# Patient Record
Sex: Male | Born: 1985
Health system: Southern US, Community
[De-identification: ages and names within clinical notes are randomized; demographics above are authoritative.]

---
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
## 2007-03-03 ENCOUNTER — Emergency Department (HOSPITAL_COMMUNITY): Admission: EM | Admit: 2007-03-03 | Discharge: 2007-03-03 | Payer: Self-pay | Admitting: Emergency Medicine

## 2007-03-10 ENCOUNTER — Emergency Department (HOSPITAL_COMMUNITY): Admission: EM | Admit: 2007-03-10 | Discharge: 2007-03-10 | Payer: Self-pay | Admitting: Emergency Medicine

## 2008-09-02 ENCOUNTER — Emergency Department (HOSPITAL_COMMUNITY): Admission: EM | Admit: 2008-09-02 | Discharge: 2008-09-02 | Payer: Self-pay | Admitting: Emergency Medicine

## 2009-12-05 ENCOUNTER — Emergency Department (HOSPITAL_COMMUNITY): Admission: EM | Admit: 2009-12-05 | Discharge: 2009-12-06 | Payer: Self-pay | Admitting: Emergency Medicine

## 2009-12-14 ENCOUNTER — Emergency Department (HOSPITAL_COMMUNITY): Admission: EM | Admit: 2009-12-14 | Discharge: 2009-12-14 | Payer: Self-pay | Admitting: Emergency Medicine

## 2010-09-03 LAB — URINALYSIS, ROUTINE W REFLEX MICROSCOPIC
Bilirubin Urine: NEGATIVE
Glucose, UA: NEGATIVE mg/dL
Nitrite: NEGATIVE
Urobilinogen, UA: 0.2 mg/dL (ref 0.0–1.0)

## 2011-03-29 LAB — RAPID STREP SCREEN (MED CTR MEBANE ONLY): Streptococcus, Group A Screen (Direct): NEGATIVE

## 2014-06-12 ENCOUNTER — Encounter (HOSPITAL_COMMUNITY): Payer: Self-pay | Admitting: *Deleted

## 2014-06-12 ENCOUNTER — Emergency Department (HOSPITAL_COMMUNITY)
Admission: EM | Admit: 2014-06-12 | Discharge: 2014-06-13 | Disposition: A | Discharge status: Home / Self Care | Attending: Emergency Medicine | Admitting: Emergency Medicine

## 2014-06-12 ENCOUNTER — Emergency Department (HOSPITAL_COMMUNITY)

## 2014-06-12 DIAGNOSIS — R1031 Right lower quadrant pain: Secondary | ICD-10-CM | POA: Insufficient documentation

## 2014-06-12 DIAGNOSIS — M549 Dorsalgia, unspecified: Secondary | ICD-10-CM

## 2014-06-12 DIAGNOSIS — Z791 Long term (current) use of non-steroidal anti-inflammatories (NSAID): Secondary | ICD-10-CM | POA: Insufficient documentation

## 2014-06-12 DIAGNOSIS — Z79899 Other long term (current) drug therapy: Secondary | ICD-10-CM | POA: Insufficient documentation

## 2014-06-12 DIAGNOSIS — R110 Nausea: Secondary | ICD-10-CM | POA: Insufficient documentation

## 2014-06-12 DIAGNOSIS — R531 Weakness: Secondary | ICD-10-CM | POA: Insufficient documentation

## 2014-06-12 DIAGNOSIS — Z720 Tobacco use: Secondary | ICD-10-CM | POA: Insufficient documentation

## 2014-06-12 LAB — URINALYSIS, ROUTINE W REFLEX MICROSCOPIC
BILIRUBIN URINE: NEGATIVE
GLUCOSE, UA: NEGATIVE mg/dL
Hgb urine dipstick: NEGATIVE
Ketones, ur: NEGATIVE mg/dL
Leukocytes, UA: NEGATIVE
NITRITE: NEGATIVE
PROTEIN: NEGATIVE mg/dL
Specific Gravity, Urine: 1.021 (ref 1.005–1.030)
UROBILINOGEN UA: 0.2 mg/dL (ref 0.0–1.0)
pH: 6.5 (ref 5.0–8.0)

## 2014-06-12 LAB — COMPREHENSIVE METABOLIC PANEL
ALK PHOS: 59 U/L (ref 39–117)
ALT: 21 U/L (ref 0–53)
AST: 30 U/L (ref 0–37)
Albumin: 3.9 g/dL (ref 3.5–5.2)
Anion gap: 6 (ref 5–15)
BILIRUBIN TOTAL: 0.7 mg/dL (ref 0.3–1.2)
BUN: 11 mg/dL (ref 6–23)
CHLORIDE: 106 meq/L (ref 96–112)
CO2: 27 mmol/L (ref 19–32)
Calcium: 9.2 mg/dL (ref 8.4–10.5)
Creatinine, Ser: 0.83 mg/dL (ref 0.50–1.35)
GLUCOSE: 101 mg/dL — AB (ref 70–99)
POTASSIUM: 4.6 mmol/L (ref 3.5–5.1)
Sodium: 139 mmol/L (ref 135–145)
TOTAL PROTEIN: 6.3 g/dL (ref 6.0–8.3)

## 2014-06-12 LAB — CBC WITH DIFFERENTIAL/PLATELET
BASOS PCT: 0 % (ref 0–1)
Basophils Absolute: 0 10*3/uL (ref 0.0–0.1)
Eosinophils Absolute: 0.1 10*3/uL (ref 0.0–0.7)
Eosinophils Relative: 3 % (ref 0–5)
HCT: 38.5 % — ABNORMAL LOW (ref 39.0–52.0)
Hemoglobin: 13.4 g/dL (ref 13.0–17.0)
LYMPHS ABS: 1.9 10*3/uL (ref 0.7–4.0)
Lymphocytes Relative: 39 % (ref 12–46)
MCH: 30.2 pg (ref 26.0–34.0)
MCHC: 34.8 g/dL (ref 30.0–36.0)
MCV: 86.9 fL (ref 78.0–100.0)
MONO ABS: 0.4 10*3/uL (ref 0.1–1.0)
MONOS PCT: 8 % (ref 3–12)
NEUTROS ABS: 2.4 10*3/uL (ref 1.7–7.7)
Neutrophils Relative %: 50 % (ref 43–77)
Platelets: 292 10*3/uL (ref 150–400)
RBC: 4.43 MIL/uL (ref 4.22–5.81)
RDW: 11.8 % (ref 11.5–15.5)
WBC: 4.8 10*3/uL (ref 4.0–10.5)

## 2014-06-12 LAB — LIPASE, BLOOD: Lipase: 42 U/L (ref 11–59)

## 2014-06-12 NOTE — ED Notes (Signed)
Pt is here with right lower back pain that radiates to RLQ since the beginning of December.  Pt has some pain with urination.  Pt states that he has had CT

## 2014-06-12 NOTE — ED Notes (Signed)
Pt received flu shot beginning of December, then started having RLQ pain radiating to back, with some right leg weakness. Pt also c/o urinary freqency. Pt has had a scan, has brought paperwork from previous visit to hospital.

## 2014-06-12 NOTE — ED Provider Notes (Signed)
CSN: [id number]     Arrival date & time 06/12/14  1724 History   First MD Initiated Contact with Patient 06/12/14 2125     Chief Complaint  Patient presents with  . Back Pain  . Abdominal Pain   HPI  Patient is a 28 year old male who presents emergency room for evaluation of back pain, abdominal pain, and urinary frequency and urgency. Patient states that he has been having back pain which radiates around to the right lower quadrant. He states his pain is constant and achy. He feels that it is associated with some right leg weakness.he also feels that he has some tingling and numbness with some positions. He feels that he has had some pain with sitting on his bottom.he feels that he has been limping and walking strange. He states that he has been evaluated at another hospital and had a CT scan of his abdomen and pelvis which was negative. He would like to have an MRI of his back today. Patient also states that he feels that some of his symptoms are coming from a flu shot which he received on December 3.patient admits to some urinary frequency and urgency. He denies hematuria. He denies penile discharge. He denies a history of STDs. He states that he is sexually active but does not use condoms every time. Patient denies any back injury that he is aware of. He has no history of back problems. He denies history of osteoporosis, IV drug use, loss of his bowel or bladder, cancer.  Patient states that he is otherwise healthy. He is currently taking naproxen and Flexeril which he feels helps but has not completely resolved his issues.  History reviewed. No pertinent past medical history. History reviewed. No pertinent past surgical history. No family history on file. History  Substance Use Topics  . Smoking status: Current Every Day Smoker  . Smokeless tobacco: Not on file  . Alcohol Use: No    Review of Systems  Constitutional: Negative for fever, chills and fatigue.  Respiratory: Positive for  cough. Negative for shortness of breath.   Cardiovascular: Negative for chest pain and palpitations.  Gastrointestinal: Positive for nausea and abdominal pain. Negative for vomiting, diarrhea, constipation, blood in stool and anal bleeding.  Genitourinary: Positive for urgency and frequency. Negative for dysuria, hematuria, flank pain, decreased urine volume, discharge, genital sores, penile pain and testicular pain.  Musculoskeletal: Positive for back pain and gait problem.  Neurological: Positive for weakness. Negative for numbness.  All other systems reviewed and are negative.     Allergies  Review of patient's allergies indicates no known allergies.  Home Medications   Prior to Admission medications   Medication Sig Start Date End Date Taking? Authorizing Provider  cyclobenzaprine (FLEXERIL) 10 MG tablet Take 10 mg by mouth at bedtime.   Yes Historical Provider, MD  naproxen (NAPROSYN) 500 MG tablet Take 500 mg by mouth 3 (three) times daily with meals.   Yes Historical Provider, MD  predniSONE (DELTASONE) 10 MG tablet Day 1 take 6 pills Day 2 take 5 pills Day 3 take 4 pills Day 4 take 3 pills Day 5 take 2 pills Day 6 take 1 pill 06/13/14   Jilene Spohr A Forcucci, PA-C   BP 117/79 mmHg  Pulse 80  Temp(Src) 98.1 F (36.7 C) (Oral)  Resp 16  SpO2 99% Physical Exam  Constitutional: He is oriented to person, place, and time. He appears well-developed and well-nourished. No distress.  HENT:  Head: Normocephalic and atraumatic.  Mouth/Throat: Oropharynx is clear and moist. No oropharyngeal exudate.  Eyes: Conjunctivae and EOM are normal. Pupils are equal, round, and reactive to light. No scleral icterus.  Neck: Normal range of motion. Neck supple. No JVD present. No thyromegaly present.  Cardiovascular: Normal rate, regular rhythm, normal heart sounds and intact distal pulses.  Exam reveals no gallop and no friction rub.   No murmur heard. Pulmonary/Chest: Effort normal and  breath sounds normal. No respiratory distress. He has no wheezes. He has no rales. He exhibits no tenderness.  Abdominal: Soft. Normal appearance and bowel sounds are normal. He exhibits no distension and no mass. There is tenderness in the right lower quadrant and suprapubic area. There is no rigidity, no rebound, no guarding, no tenderness at McBurney's point and negative Murphy's sign.  Musculoskeletal: Normal range of motion.  Lymphadenopathy:    He has no cervical adenopathy.  Neurological: He is alert and oriented to person, place, and time. He has normal strength. No cranial nerve deficit or sensory deficit.  Skin: Skin is warm and dry. He is not diaphoretic.  Psychiatric: He has a normal mood and affect. His behavior is normal. Judgment and thought content normal.  Nursing note and vitals reviewed.   ED Course  Procedures (including critical care time) Labs Review Labs Reviewed  CBC WITH DIFFERENTIAL - Abnormal; Notable for the following:    HCT 38.5 (*)    All other components within normal limits  COMPREHENSIVE METABOLIC PANEL - Abnormal; Notable for the following:    Glucose, Bld 101 (*)    All other components within normal limits  GC/CHLAMYDIA PROBE AMP  URINALYSIS, ROUTINE W REFLEX MICROSCOPIC  LIPASE, BLOOD    Imaging Review Dg Lumbar Spine Complete  06/12/2014   CLINICAL DATA:  Chronic lower back pain for 1 month. Initial encounter.  EXAM: LUMBAR SPINE - COMPLETE 4+ VIEW  COMPARISON:  Lumbar spine MRI performed 09/18/2005  FINDINGS: There is no evidence of fracture or subluxation. Vertebral bodies demonstrate normal height and alignment. Intervertebral disc spaces are preserved. The visualized neural foramina are grossly unremarkable in appearance.  The visualized bowel gas pattern is unremarkable in appearance; air and stool are noted within the colon. The sacroiliac joints are within normal limits.  IMPRESSION: No evidence of fracture or subluxation along the lumbar  spine.   Electronically Signed   By: Roanna RaiderJeffery  Chang M.D.   On: 06/12/2014 23:56     EKG Interpretation None      MDM   Final diagnoses:  Back pain  Right lower quadrant abdominal pain   Patient is a 28 y.o. Male who presents to the ED for evaluation of back pain and abdominal pain.  Physical exam reveals no red flags for cauda equina.  There are no focal neuro deficits.  Plain film xrays are negative.  UA is negative.  Prostate exam is unremarkable.  GC is pending.  Labs are negative.  Suspect that this may be musculoskeletal in nature.  Will try a course of prednisone and will refer the patient to Dr. Jule SerNudleman for further evaluation.  Patient to return for symptoms of cauda equina.  Patient is stable for discharge at this time.  Patient discussed with Dr. Lynelle DoctorKnapp who agrees with the above workup and plan.      Eben Burowourtney A Forcucci, PA-C 06/13/14 [id number]  Linwood DibblesJon Knapp, MD 06/15/14 [phone]

## 2014-06-12 NOTE — ED Notes (Signed)
Pt alert and oriented. VSS

## 2014-06-13 MED ORDER — PREDNISONE 10 MG PO TABS: ORAL_TABLET | ORAL | Status: DC

## 2014-06-13 NOTE — Discharge Instructions (Signed)
Back Pain, Adult Low back pain is very common. About 1 in 5 people have back pain.The cause of low back pain is rarely dangerous. The pain often gets better over time.About half of people with a sudden onset of back pain feel better in just 2 weeks. About 8 in 10 people feel better by 6 weeks.  CAUSES Some common causes of back pain include:  Strain of the muscles or ligaments supporting the spine.  Wear and tear (degeneration) of the spinal discs.  Arthritis.  Direct injury to the back. DIAGNOSIS Most of the time, the direct cause of low back pain is not known.However, back pain can be treated effectively even when the exact cause of the pain is unknown.Answering your caregiver's questions about your overall health and symptoms is one of the most accurate ways to make sure the cause of your pain is not dangerous. If your caregiver needs more information, he or she may order lab work or imaging tests (X-rays or MRIs).However, even if imaging tests show changes in your back, this usually does not require surgery. HOME CARE INSTRUCTIONS For many people, back pain returns.Since low back pain is rarely dangerous, it is often a condition that people can learn to manageon their own.   Remain active. It is stressful on the back to sit or stand in one place. Do not sit, drive, or stand in one place for more than 30 minutes at a time. Take short walks on level surfaces as soon as pain allows.Try to increase the length of time you walk each day.  Do not stay in bed.Resting more than 1 or 2 days can delay your recovery.  Do not avoid exercise or work.Your body is made to move.It is not dangerous to be active, even though your back may hurt.Your back will likely heal faster if you return to being active before your pain is gone.  Pay attention to your body when you bend and lift. Many people have less discomfortwhen lifting if they bend their knees, keep the load close to their bodies,and  avoid twisting. Often, the most comfortable positions are those that put less stress on your recovering back.  Find a comfortable position to sleep. Use a firm mattress and lie on your side with your knees slightly bent. If you lie on your back, put a pillow under your knees.  Only take over-the-counter or prescription medicines as directed by your caregiver. Over-the-counter medicines to reduce pain and inflammation are often the most helpful.Your caregiver may prescribe muscle relaxant drugs.These medicines help dull your pain so you can more quickly return to your normal activities and healthy exercise.  Put ice on the injured area.  Put ice in a plastic bag.  Place a towel between your skin and the bag.  Leave the ice on for 15-20 minutes, 03-04 times a day for the first 2 to 3 days. After that, ice and heat may be alternated to reduce pain and spasms.  Ask your caregiver about trying back exercises and gentle massage. This may be of some benefit.  Avoid feeling anxious or stressed.Stress increases muscle tension and can worsen back pain.It is important to recognize when you are anxious or stressed and learn ways to manage it.Exercise is a great option. SEEK MEDICAL CARE IF:  You have pain that is not relieved with rest or medicine.  You have pain that does not improve in 1 week.  You have new symptoms.  You are generally not feeling well. SEEK   IMMEDIATE MEDICAL CARE IF:   You have pain that radiates from your back into your legs.  You develop new bowel or bladder control problems.  You have unusual weakness or numbness in your arms or legs.  You develop nausea or vomiting.  You develop abdominal pain.  You feel faint. Document Released: 06/04/2005 Document Revised: 12/04/2011 Document Reviewed: 10/06/2013 Exit[employer] Patient Information 2015 [employer]. This information is not intended to replace advice given to you by your health care provider. Make sure you  discuss any questions you have with your health care provider.  

## 2017-09-27 ENCOUNTER — Other Ambulatory Visit: Payer: Self-pay

## 2017-09-27 ENCOUNTER — Emergency Department (HOSPITAL_COMMUNITY)
Admission: EM | Admit: 2017-09-27 | Discharge: 2017-09-28 | Disposition: A | Discharge status: Home / Self Care | Payer: Self-pay | Attending: Emergency Medicine | Admitting: Emergency Medicine

## 2017-09-27 ENCOUNTER — Emergency Department (HOSPITAL_COMMUNITY): Payer: Self-pay

## 2017-09-27 ENCOUNTER — Encounter (HOSPITAL_COMMUNITY): Payer: Self-pay

## 2017-09-27 DIAGNOSIS — F17200 Nicotine dependence, unspecified, uncomplicated: Secondary | ICD-10-CM | POA: Insufficient documentation

## 2017-09-27 DIAGNOSIS — M79674 Pain in right toe(s): Secondary | ICD-10-CM | POA: Insufficient documentation

## 2017-09-27 DIAGNOSIS — Z79899 Other long term (current) drug therapy: Secondary | ICD-10-CM | POA: Insufficient documentation

## 2017-09-27 NOTE — ED Triage Notes (Signed)
Pt states that he kicked a chair today and hurt his R pinky toe

## 2017-09-28 MED ORDER — ACETAMINOPHEN 325 MG PO TABS: 650.0000 mg | ORAL_TABLET | Freq: Four times a day (QID) | ORAL | 0 refills | Status: DC | PRN

## 2017-09-28 MED ORDER — IBUPROFEN 400 MG PO TABS: 400.0000 mg | ORAL_TABLET | Freq: Four times a day (QID) | ORAL | 0 refills | Status: DC | PRN

## 2017-09-28 NOTE — ED Provider Notes (Signed)
[facility]   CSN: [id number] Arrival date & time: 09/27/17  2209     History   Chief Complaint Chief Complaint  Patient presents with  . Toe Pain    HPI Andres Weaver HasM Saiki is a 32 y.o. male.  HPI   Patient is a 32 year old male is presenting to the ED complaining of sudden onset, constant right fifth toe pain that began after he stubbed his toe on a chair.  Pain is 10/10 in is worse with movement.  Is been able to walk.  Denies numbness to the toe.  Denies any breaks in skin.  No other injuries.  Not tried taking any medication for the pain.  History reviewed. No pertinent past medical history.  There are no active problems to display for this patient.   History reviewed. No pertinent surgical history.      Home Medications    Prior to Admission medications   Medication Sig Start Date End Date Taking? Authorizing Provider  acetaminophen (TYLENOL) 325 MG tablet Take 2 tablets (650 mg total) by mouth every 6 (six) hours as needed. Do not take more than 4000mg  of tylenol per day 09/28/17   Yeshua Stryker S, PA-C  cyclobenzaprine (FLEXERIL) 10 MG tablet Take 10 mg by mouth at bedtime.    [provider]  ibuprofen (ADVIL,MOTRIN) 400 MG tablet Take 1 tablet (400 mg total) by mouth every 6 (six) hours as needed. 09/28/17   Oral Hallgren S, PA-C  naproxen (NAPROSYN) 500 MG tablet Take 500 mg by mouth 3 (three) times daily with meals.    [provider]  predniSONE (DELTASONE) 10 MG tablet Day 1 take 6 pills Day 2 take 5 pills Day 3 take 4 pills Day 4 take 3 pills Day 5 take 2 pills Day 6 take 1 pill 06/13/14   Terri PiedraForcucci, Courtney, PA-C    Family History No family history on file.  Social History Social History   Tobacco Use  . Smoking status: Current Every Day Smoker  . Smokeless tobacco: Never Used  Substance Use Topics  . Alcohol use: No  . Drug use: No     Allergies   Patient has no known  allergies.   Review of Systems Review of Systems  Musculoskeletal:       Toe pain  Skin: Negative for wound.  Neurological: Negative for numbness.     Physical Exam Updated Vital Signs BP 131/75 (BP Location: Right Arm)   Pulse 64   Temp 99.2 F (37.3 C) (Oral)   Resp 16   SpO2 100%   Physical Exam  Constitutional: He is oriented to person, place, and time. He appears well-developed and well-nourished. No distress.  Eyes: Conjunctivae are normal.  Cardiovascular: Normal rate and regular rhythm.  Pulmonary/Chest: Effort normal.  Musculoskeletal:  Tenderness along the lateral aspect of the right foot with exquisite tenderness to the right fifth toe.  Patient is able to wiggle toes however it is painful.  He has sensation intact to all 5 toes on the right foot.  Brisk cap refill in all 5 toes in the right foot.  No obvious breaks in the skin.  Neurological: He is alert and oriented to person, place, and time.  Skin: Skin is warm and dry.     ED Treatments / Results  Labs (all labs ordered are listed, but only abnormal results are displayed) Labs Reviewed - No data to display  EKG None  Radiology Dg Foot  Complete Right  Result Date: 09/27/2017 CLINICAL DATA:  Patient stubbed foot against a chair. Right lateral foot pain. EXAM: RIGHT FOOT COMPLETE - 3+ VIEW COMPARISON:  None. FINDINGS: There is no evidence of fracture or dislocation. There is no evidence of arthropathy or other focal bone abnormality. Soft tissue swelling along the lateral aspect of the forefoot. No acute fracture is identified. No joint dislocation is seen. IMPRESSION: Mild soft tissue swelling along the lateral aspect of the forefoot. No fracture nor joint dislocation Electronically Signed   By: Tollie Eth M.D.   On: 09/27/2017 23:04    Procedures Procedures (including critical care time)  Medications Ordered in ED Medications - No data to display   Initial Impression / Assessment and Plan / ED  Course  I have reviewed the triage vital signs and the nursing notes.  Pertinent labs & imaging results that were available during my care of the patient were reviewed by me and considered in my medical decision making (see chart for details).      Final Clinical Impressions(s) / ED Diagnoses   Final diagnoses:  Pain of toe of right foot   Patient presenting with toe pain.  Vital signs stable, nontoxic appearing.  X-ray of right foot negative for acute fracture or abnormality.  Postop boot given to patient.  Pain medications given to patient at discharge.  Advised to follow-up with primary care and return if worse.  All questions answered and patient understands plan and reasons to return.  ED Discharge Orders        Ordered    acetaminophen (TYLENOL) 325 MG tablet  Every 6 hours PRN     09/28/17 0041    ibuprofen (ADVIL,MOTRIN) 400 MG tablet  Every 6 hours PRN     09/28/17 0041       Christi Wirick S, PA-C 09/28/17 0045    Shaune Pollack, MD 09/28/17 1112

## 2017-09-28 NOTE — Discharge Instructions (Addendum)
The x-ray of your toe today was negative for any acute fracture or dislocation.  You may treat your pain with Tylenol and ibuprofen.  You may alternate taking Tylenol and Ibuprofen as needed for pain control. You may take 400-600 mg of ibuprofen every 6 hours and [phone] mg of Tylenol every 6 hours. Do not exceed 4000 mg of Tylenol daily as this can lead to liver damage. Also, make sure to take Ibuprofen with meals as it can cause an upset stomach. Do not take other NSAIDs while taking Ibuprofen such as (Aleve, Naprosyn, Aspirin, Celebrex, etc) and do not take more than the prescribed dose as this can lead to ulcers and bleeding in your GI tract. You may use warm and cold compresses to help with your symptoms.   You may wear the postop boot for comfort over the next several days until your pain has improved.  Please follow up with your primary doctor within the next 7-10 days for re-evaluation and further treatment of your symptoms.   Please return to the ER sooner if you have any new or worsening symptoms.

## 2019-05-13 ENCOUNTER — Emergency Department (HOSPITAL_COMMUNITY): Payer: Self-pay

## 2019-05-13 ENCOUNTER — Other Ambulatory Visit: Payer: Self-pay

## 2019-05-13 ENCOUNTER — Emergency Department (HOSPITAL_COMMUNITY): Payer: Self-pay | Admitting: Anesthesiology

## 2019-05-13 ENCOUNTER — Encounter (HOSPITAL_COMMUNITY)
Admission: EM | Disposition: A | Discharge status: Home / Self Care | Payer: Self-pay | Source: Home / Self Care | Attending: Emergency Medicine

## 2019-05-13 ENCOUNTER — Encounter (HOSPITAL_COMMUNITY): Payer: Self-pay | Admitting: Emergency Medicine

## 2019-05-13 ENCOUNTER — Ambulatory Visit (HOSPITAL_COMMUNITY)
Admission: EM | Admit: 2019-05-13 | Discharge: 2019-05-13 | Disposition: A | Discharge status: Home / Self Care | Payer: Self-pay | Attending: Emergency Medicine | Admitting: Emergency Medicine

## 2019-05-13 DIAGNOSIS — K353 Acute appendicitis with localized peritonitis, without perforation or gangrene: Secondary | ICD-10-CM

## 2019-05-13 DIAGNOSIS — Z20828 Contact with and (suspected) exposure to other viral communicable diseases: Secondary | ICD-10-CM | POA: Insufficient documentation

## 2019-05-13 DIAGNOSIS — R188 Other ascites: Secondary | ICD-10-CM | POA: Insufficient documentation

## 2019-05-13 DIAGNOSIS — F17210 Nicotine dependence, cigarettes, uncomplicated: Secondary | ICD-10-CM | POA: Insufficient documentation

## 2019-05-13 DIAGNOSIS — Z888 Allergy status to other drugs, medicaments and biological substances status: Secondary | ICD-10-CM | POA: Insufficient documentation

## 2019-05-13 DIAGNOSIS — K3580 Unspecified acute appendicitis: Secondary | ICD-10-CM | POA: Insufficient documentation

## 2019-05-13 HISTORY — PX: LAPAROSCOPIC APPENDECTOMY: SHX408

## 2019-05-13 LAB — COMPREHENSIVE METABOLIC PANEL
ALT: 14 U/L (ref 0–44)
AST: 20 U/L (ref 15–41)
Albumin: 4.4 g/dL (ref 3.5–5.0)
Alkaline Phosphatase: 60 U/L (ref 38–126)
Anion gap: 13 (ref 5–15)
BUN: 12 mg/dL (ref 6–20)
CO2: 24 mmol/L (ref 22–32)
Calcium: 9.3 mg/dL (ref 8.9–10.3)
Chloride: 99 mmol/L (ref 98–111)
Creatinine, Ser: 0.91 mg/dL (ref 0.61–1.24)
GFR calc Af Amer: >60 mL/min (ref >60)
GFR calc non Af Amer: >60 mL/min (ref >60)
Glucose, Bld: 115 mg/dL — ABNORMAL HIGH (ref 70–99)
Potassium: 3.4 mmol/L — ABNORMAL LOW (ref 3.5–5.1)
Sodium: 136 mmol/L (ref 135–145)
Total Bilirubin: 1.2 mg/dL (ref 0.3–1.2)
Total Protein: 7.8 g/dL (ref 6.5–8.1)

## 2019-05-13 LAB — CBC
HCT: 44.3 % (ref 39.0–52.0)
Hemoglobin: 14.8 g/dL (ref 13.0–17.0)
MCH: 30.8 pg (ref 26.0–34.0)
MCHC: 33.4 g/dL (ref 30.0–36.0)
MCV: 92.3 fL (ref 80.0–100.0)
Platelets: 284 10*3/uL (ref 150–400)
RBC: 4.8 MIL/uL (ref 4.22–5.81)
RDW: 11.6 % (ref 11.5–15.5)
WBC: 12.3 10*3/uL — ABNORMAL HIGH (ref 4.0–10.5)
nRBC: 0 % (ref 0.0–0.2)

## 2019-05-13 LAB — POC SARS CORONAVIRUS 2 AG -  ED: SARS Coronavirus 2 Ag: NEGATIVE

## 2019-05-13 LAB — URINALYSIS, ROUTINE W REFLEX MICROSCOPIC
Bilirubin Urine: NEGATIVE
Glucose, UA: NEGATIVE mg/dL
Hgb urine dipstick: NEGATIVE
Ketones, ur: 20 mg/dL — AB
Leukocytes,Ua: NEGATIVE
Nitrite: NEGATIVE
Protein, ur: NEGATIVE mg/dL
Specific Gravity, Urine: 1.032 — ABNORMAL HIGH (ref 1.005–1.030)
pH: 6 (ref 5.0–8.0)

## 2019-05-13 LAB — LIPASE, BLOOD: Lipase: 28 U/L (ref 11–51)

## 2019-05-13 LAB — SARS CORONAVIRUS 2 BY RT PCR (HOSPITAL ORDER, PERFORMED IN ~~LOC~~ HOSPITAL LAB): SARS Coronavirus 2: NEGATIVE

## 2019-05-13 SURGERY — APPENDECTOMY, LAPAROSCOPIC
Anesthesia: General | Site: Abdomen

## 2019-05-13 MED ORDER — KETOROLAC TROMETHAMINE 15 MG/ML IJ SOLN
15.0000 mg | Freq: Once | INTRAMUSCULAR | Status: AC
  Administered 2019-05-13: 15 mg via INTRAVENOUS
  Filled 2019-05-13: qty 1

## 2019-05-13 MED ORDER — MIDAZOLAM HCL 2 MG/2ML IJ SOLN
INTRAMUSCULAR | Status: AC
  Filled 2019-05-13: qty 2

## 2019-05-13 MED ORDER — MORPHINE SULFATE (PF) 4 MG/ML IV SOLN
4.0000 mg | Freq: Once | INTRAVENOUS | Status: AC
  Administered 2019-05-13: 10:00:00 4 mg via INTRAVENOUS
  Filled 2019-05-13: qty 1

## 2019-05-13 MED ORDER — HYDROMORPHONE HCL 1 MG/ML IJ SOLN
0.5000 mg | INTRAMUSCULAR | Status: DC | PRN
  Administered 2019-05-13: 11:00:00 1 mg via INTRAVENOUS
  Filled 2019-05-13: qty 1

## 2019-05-13 MED ORDER — SUCCINYLCHOLINE CHLORIDE 200 MG/10ML IV SOSY
PREFILLED_SYRINGE | INTRAVENOUS | Status: AC
  Filled 2019-05-13: qty 10

## 2019-05-13 MED ORDER — LIDOCAINE 2% (20 MG/ML) 5 ML SYRINGE
INTRAMUSCULAR | Status: DC | PRN
  Administered 2019-05-13: 60 mg via INTRAVENOUS

## 2019-05-13 MED ORDER — SODIUM CHLORIDE 0.9 % IV BOLUS (SEPSIS)
1000.0000 mL | Freq: Once | INTRAVENOUS | Status: AC
  Administered 2019-05-13: 1000 mL via INTRAVENOUS

## 2019-05-13 MED ORDER — SUCCINYLCHOLINE CHLORIDE 200 MG/10ML IV SOSY
PREFILLED_SYRINGE | INTRAVENOUS | Status: AC
  Filled 2019-05-13: qty 20

## 2019-05-13 MED ORDER — SUCCINYLCHOLINE CHLORIDE 200 MG/10ML IV SOSY
PREFILLED_SYRINGE | INTRAVENOUS | Status: DC | PRN
  Administered 2019-05-13: 100 mg via INTRAVENOUS

## 2019-05-13 MED ORDER — POTASSIUM CHLORIDE IN NACL 20-0.9 MEQ/L-% IV SOLN
INTRAVENOUS | Status: DC
  Administered 2019-05-13: 12:00:00 via INTRAVENOUS
  Filled 2019-05-13: qty 1000

## 2019-05-13 MED ORDER — ROCURONIUM BROMIDE 10 MG/ML (PF) SYRINGE
PREFILLED_SYRINGE | INTRAVENOUS | Status: AC
  Filled 2019-05-13: qty 20

## 2019-05-13 MED ORDER — ROCURONIUM BROMIDE 10 MG/ML (PF) SYRINGE
PREFILLED_SYRINGE | INTRAVENOUS | Status: AC
  Filled 2019-05-13: qty 10

## 2019-05-13 MED ORDER — SODIUM CHLORIDE 0.9% FLUSH: 3.0000 mL | Freq: Once | INTRAVENOUS | Status: DC

## 2019-05-13 MED ORDER — CELECOXIB 200 MG PO CAPS
400.0000 mg | ORAL_CAPSULE | Freq: Once | ORAL | Status: AC
  Administered 2019-05-13: 13:00:00 400 mg via ORAL

## 2019-05-13 MED ORDER — LIDOCAINE 2% (20 MG/ML) 5 ML SYRINGE
INTRAMUSCULAR | Status: AC
  Filled 2019-05-13: qty 10

## 2019-05-13 MED ORDER — BUPIVACAINE HCL (PF) 0.25 % IJ SOLN
INTRAMUSCULAR | Status: AC
  Filled 2019-05-13: qty 60

## 2019-05-13 MED ORDER — ONDANSETRON HCL 4 MG/2ML IJ SOLN
INTRAMUSCULAR | Status: AC
  Filled 2019-05-13: qty 2

## 2019-05-13 MED ORDER — FENTANYL CITRATE (PF) 250 MCG/5ML IJ SOLN
INTRAMUSCULAR | Status: AC
  Filled 2019-05-13: qty 5

## 2019-05-13 MED ORDER — FENTANYL CITRATE (PF) 250 MCG/5ML IJ SOLN
INTRAMUSCULAR | Status: DC | PRN
  Administered 2019-05-13: 100 ug via INTRAVENOUS

## 2019-05-13 MED ORDER — TRAMADOL HCL 50 MG PO TABS
ORAL_TABLET | ORAL | Status: AC
  Filled 2019-05-13: qty 1

## 2019-05-13 MED ORDER — MIDAZOLAM HCL 5 MG/5ML IJ SOLN
INTRAMUSCULAR | Status: DC | PRN
  Administered 2019-05-13: 2 mg via INTRAVENOUS

## 2019-05-13 MED ORDER — LIDOCAINE 2% (20 MG/ML) 5 ML SYRINGE
INTRAMUSCULAR | Status: AC
  Filled 2019-05-13: qty 5

## 2019-05-13 MED ORDER — LACTATED RINGERS IV SOLN
INTRAVENOUS | Status: DC
  Administered 2019-05-13 (×2): via INTRAVENOUS

## 2019-05-13 MED ORDER — ONDANSETRON HCL 4 MG/2ML IJ SOLN
INTRAMUSCULAR | Status: AC
  Filled 2019-05-13: qty 4

## 2019-05-13 MED ORDER — ONDANSETRON HCL 4 MG/2ML IJ SOLN
4.0000 mg | Freq: Once | INTRAMUSCULAR | Status: AC
  Administered 2019-05-13: 08:00:00 4 mg via INTRAVENOUS
  Filled 2019-05-13: qty 2

## 2019-05-13 MED ORDER — PHENYLEPHRINE 40 MCG/ML (10ML) SYRINGE FOR IV PUSH (FOR BLOOD PRESSURE SUPPORT)
PREFILLED_SYRINGE | INTRAVENOUS | Status: AC
  Filled 2019-05-13: qty 20

## 2019-05-13 MED ORDER — ACETAMINOPHEN 500 MG PO TABS
1000.0000 mg | ORAL_TABLET | Freq: Once | ORAL | Status: AC
  Administered 2019-05-13: 13:00:00 1000 mg via ORAL

## 2019-05-13 MED ORDER — TRAMADOL HCL 50 MG PO TABS
50.0000 mg | ORAL_TABLET | Freq: Once | ORAL | Status: AC
  Administered 2019-05-13: 16:00:00 50 mg via ORAL

## 2019-05-13 MED ORDER — IOHEXOL 300 MG/ML  SOLN
80.0000 mL | Freq: Once | INTRAMUSCULAR | Status: AC | PRN
  Administered 2019-05-13: 09:00:00 80 mL via INTRAVENOUS

## 2019-05-13 MED ORDER — BUPIVACAINE HCL (PF) 0.25 % IJ SOLN
INTRAMUSCULAR | Status: DC | PRN
  Administered 2019-05-13: 5 mL

## 2019-05-13 MED ORDER — CELECOXIB 200 MG PO CAPS
ORAL_CAPSULE | ORAL | Status: AC
  Administered 2019-05-13: 400 mg via ORAL
  Filled 2019-05-13: qty 2

## 2019-05-13 MED ORDER — PROPOFOL 10 MG/ML IV BOLUS
INTRAVENOUS | Status: DC | PRN
  Administered 2019-05-13: 200 mg via INTRAVENOUS

## 2019-05-13 MED ORDER — PROPOFOL 10 MG/ML IV BOLUS
INTRAVENOUS | Status: AC
  Filled 2019-05-13: qty 20

## 2019-05-13 MED ORDER — SUGAMMADEX SODIUM 200 MG/2ML IV SOLN
INTRAVENOUS | Status: DC | PRN
  Administered 2019-05-13: 200 mg via INTRAVENOUS

## 2019-05-13 MED ORDER — DEXAMETHASONE SODIUM PHOSPHATE 10 MG/ML IJ SOLN
INTRAMUSCULAR | Status: AC
  Filled 2019-05-13: qty 1

## 2019-05-13 MED ORDER — ACETAMINOPHEN 500 MG PO TABS
ORAL_TABLET | ORAL | Status: AC
  Administered 2019-05-13: 1000 mg via ORAL
  Filled 2019-05-13: qty 2

## 2019-05-13 MED ORDER — PROMETHAZINE HCL 25 MG/ML IJ SOLN: 6.2500 mg | INTRAMUSCULAR | Status: DC | PRN

## 2019-05-13 MED ORDER — SODIUM CHLORIDE 0.9 % IR SOLN
Status: DC | PRN
  Administered 2019-05-13: 1000 mL

## 2019-05-13 MED ORDER — TRAMADOL HCL 50 MG PO TABS: 50.0000 mg | ORAL_TABLET | Freq: Four times a day (QID) | ORAL | 0 refills | Status: DC | PRN

## 2019-05-13 MED ORDER — 0.9 % SODIUM CHLORIDE (POUR BTL) OPTIME
TOPICAL | Status: DC | PRN
  Administered 2019-05-13: 1000 mL

## 2019-05-13 MED ORDER — PHENYLEPHRINE 40 MCG/ML (10ML) SYRINGE FOR IV PUSH (FOR BLOOD PRESSURE SUPPORT)
PREFILLED_SYRINGE | INTRAVENOUS | Status: DC | PRN
  Administered 2019-05-13: 80 ug via INTRAVENOUS

## 2019-05-13 MED ORDER — ONDANSETRON HCL 4 MG/2ML IJ SOLN
4.0000 mg | Freq: Four times a day (QID) | INTRAMUSCULAR | Status: DC | PRN
  Administered 2019-05-13: 14:00:00 4 mg via INTRAVENOUS

## 2019-05-13 MED ORDER — ROCURONIUM BROMIDE 10 MG/ML (PF) SYRINGE
PREFILLED_SYRINGE | INTRAVENOUS | Status: DC | PRN
  Administered 2019-05-13: 40 mg via INTRAVENOUS

## 2019-05-13 MED ORDER — IBUPROFEN 200 MG PO TABS: 600.0000 mg | ORAL_TABLET | Freq: Four times a day (QID) | ORAL | Status: DC | PRN

## 2019-05-13 MED ORDER — ONDANSETRON 4 MG PO TBDP: 4.0000 mg | ORAL_TABLET | Freq: Once | ORAL | Status: DC | PRN

## 2019-05-13 MED ORDER — DEXAMETHASONE SODIUM PHOSPHATE 10 MG/ML IJ SOLN
INTRAMUSCULAR | Status: AC
  Filled 2019-05-13: qty 2

## 2019-05-13 MED ORDER — DEXAMETHASONE SODIUM PHOSPHATE 10 MG/ML IJ SOLN
INTRAMUSCULAR | Status: DC | PRN
  Administered 2019-05-13: 4 mg via INTRAVENOUS

## 2019-05-13 MED ORDER — STERILE WATER FOR IRRIGATION IR SOLN
Status: DC | PRN
  Administered 2019-05-13: 1000 mL

## 2019-05-13 MED ORDER — FENTANYL CITRATE (PF) 100 MCG/2ML IJ SOLN: 25.0000 ug | INTRAMUSCULAR | Status: DC | PRN

## 2019-05-13 MED ORDER — ACETAMINOPHEN 500 MG PO TABS: 500.0000 mg | ORAL_TABLET | Freq: Four times a day (QID) | ORAL | Status: AC | PRN

## 2019-05-13 MED ORDER — PIPERACILLIN-TAZOBACTAM 3.375 G IVPB 30 MIN
3.3750 g | Freq: Once | INTRAVENOUS | Status: AC
  Administered 2019-05-13: 3.375 g via INTRAVENOUS
  Filled 2019-05-13: qty 50

## 2019-05-13 SURGICAL SUPPLY — 41 items
APPLIER CLIP 5 13 M/L LIGAMAX5 (MISCELLANEOUS)
BLADE CLIPPER SURG (BLADE) ×3 IMPLANT
CANISTER SUCT 3000ML PPV (MISCELLANEOUS) ×3 IMPLANT
CATH FOLEY LATEX FREE 16FR (CATHETERS) ×2
CATH FOLEY LF 16FR (CATHETERS) ×1 IMPLANT
CHLORAPREP W/TINT 26 (MISCELLANEOUS) ×3 IMPLANT
CLIP APPLIE 5 13 M/L LIGAMAX5 (MISCELLANEOUS) IMPLANT
COVER SURGICAL LIGHT HANDLE (MISCELLANEOUS) ×3 IMPLANT
COVER WAND RF STERILE (DRAPES) IMPLANT
CUTTER FLEX LINEAR 45M (STAPLE) ×3 IMPLANT
DERMABOND ADVANCED (GAUZE/BANDAGES/DRESSINGS) ×2
DERMABOND ADVANCED .7 DNX12 (GAUZE/BANDAGES/DRESSINGS) ×1 IMPLANT
ELECT REM PT RETURN 9FT ADLT (ELECTROSURGICAL) ×3
ELECTRODE REM PT RTRN 9FT ADLT (ELECTROSURGICAL) ×1 IMPLANT
GLOVE BIO SURGEON STRL SZ 6 (GLOVE) ×3 IMPLANT
GLOVE BIOGEL PI IND STRL 7.0 (GLOVE) ×1 IMPLANT
GLOVE BIOGEL PI INDICATOR 7.0 (GLOVE) ×2
GLOVE INDICATOR 6.5 STRL GRN (GLOVE) ×3 IMPLANT
GOWN STRL REUS W/ TWL LRG LVL3 (GOWN DISPOSABLE) ×3 IMPLANT
GOWN STRL REUS W/TWL LRG LVL3 (GOWN DISPOSABLE) ×6
GRASPER SUT TROCAR 14GX15 (MISCELLANEOUS) ×3 IMPLANT
KIT BASIN OR (CUSTOM PROCEDURE TRAY) ×3 IMPLANT
KIT TURNOVER KIT B (KITS) ×3 IMPLANT
NEEDLE INSUFFLATION 14GA 120MM (NEEDLE) ×3 IMPLANT
NS IRRIG 1000ML POUR BTL (IV SOLUTION) ×3 IMPLANT
PAD ARMBOARD 7.5X6 YLW CONV (MISCELLANEOUS) ×6 IMPLANT
POUCH SPECIMEN RETRIEVAL 10MM (ENDOMECHANICALS) ×3 IMPLANT
RELOAD 45 VASCULAR/THIN (ENDOMECHANICALS) ×3 IMPLANT
RELOAD STAPLE TA45 3.5 REG BLU (ENDOMECHANICALS) IMPLANT
SCISSORS LAP 5X35 DISP (ENDOMECHANICALS) IMPLANT
SET IRRIG TUBING LAPAROSCOPIC (IRRIGATION / IRRIGATOR) ×3 IMPLANT
SET TUBE SMOKE EVAC HIGH FLOW (TUBING) ×3 IMPLANT
SHEARS HARMONIC ACE PLUS 36CM (ENDOMECHANICALS) ×3 IMPLANT
SLEEVE ENDOPATH XCEL 5M (ENDOMECHANICALS) ×3 IMPLANT
SPECIMEN JAR SMALL (MISCELLANEOUS) ×3 IMPLANT
SUT MNCRL AB 4-0 PS2 18 (SUTURE) ×3 IMPLANT
TOWEL GREEN STERILE FF (TOWEL DISPOSABLE) ×3 IMPLANT
TRAY LAPAROSCOPIC MC (CUSTOM PROCEDURE TRAY) ×3 IMPLANT
TROCAR XCEL 12X100 BLDLESS (ENDOMECHANICALS) ×3 IMPLANT
TROCAR XCEL NON-BLD 5MMX100MML (ENDOMECHANICALS) ×3 IMPLANT
WATER STERILE IRR 1000ML POUR (IV SOLUTION) ×3 IMPLANT

## 2019-05-13 NOTE — Anesthesia Preprocedure Evaluation (Signed)
Anesthesia Evaluation  Patient identified by MRN, date of birth, ID band Patient awake    Reviewed: Allergy & Precautions, NPO status , Patient's Chart, lab work & pertinent test results  History of Anesthesia Complications Negative for: history of anesthetic complications  Airway Mallampati: II  TM Distance: >3 FB Neck ROM: Full    Dental no notable dental hx. (+) Dental Advisory Given   Pulmonary neg pulmonary ROS, Current Smoker,    Pulmonary exam normal        Cardiovascular negative cardio ROS Normal cardiovascular exam     Neuro/Psych negative neurological ROS  negative psych ROS   GI/Hepatic Neg liver ROS,   Endo/Other  negative endocrine ROS  Renal/GU negative Renal ROS  negative genitourinary   Musculoskeletal negative musculoskeletal ROS (+)   Abdominal   Peds negative pediatric ROS (+)  Hematology negative hematology ROS (+)   Anesthesia Other Findings   Reproductive/Obstetrics negative OB ROS                             Anesthesia Physical Anesthesia Plan  ASA: II and emergent  Anesthesia Plan: General   Post-op Pain Management:    Induction: Intravenous, Rapid sequence and Cricoid pressure planned  PONV Risk Score and Plan: 2 and Ondansetron and Dexamethasone  Airway Management Planned: Oral ETT  Additional Equipment:   Intra-op Plan:   Post-operative Plan: Extubation in OR  Informed Consent: I have reviewed the patients History and Physical, chart, labs and discussed the procedure including the risks, benefits and alternatives for the proposed anesthesia with the patient or authorized representative who has indicated his/her understanding and acceptance.     Dental advisory given  Plan Discussed with: CRNA and Anesthesiologist  Anesthesia Plan Comments:         Anesthesia Quick Evaluation

## 2019-05-13 NOTE — ED Provider Notes (Signed)
33 yo male complaining of rlq pain began 4 days ago. Work up here with ct positive for appendicitis. Denies covid infection symptoms or exposures. Patient diffuse abdominal ttp greatest rlq    Pattricia Boss, MD 05/14/19 1556

## 2019-05-13 NOTE — Anesthesia Procedure Notes (Signed)
Procedure Name: Intubation Performed by: Milford Cage, CRNA Pre-anesthesia Checklist: Patient identified, Emergency Drugs available, Suction available and Patient being monitored Patient Re-evaluated:Patient Re-evaluated prior to induction Oxygen Delivery Method: Circle System Utilized Preoxygenation: Pre-oxygenation with 100% oxygen Induction Type: IV induction, Rapid sequence and Cricoid Pressure applied Laryngoscope Size: Miller and 3 Grade View: Grade II Tube type: Oral Tube size: 7.0 mm Number of attempts: 2 Airway Equipment and Method: Stylet and Oral airway Placement Confirmation: ETT inserted through vocal cords under direct vision,  positive ETCO2 and breath sounds checked- equal and bilateral Secured at: 23 cm Tube secured with: Tape Dental Injury: Teeth and Oropharynx as per pre-operative assessment

## 2019-05-13 NOTE — H&P (Signed)
[facility] Consult/Admission Note  Andres Weaver July 17, 1985  [id number].    Requesting Provider: Dr. Jeanell Sparrow Chief Complaint/Reason for Consult: appendicitis  HPI:   Patient is an otherwise healthy 33 yo smoker who is in the [employer] who presented to the ED with complaints of RLQ abdominal pain. He states mild pain started about 2 weeks ago but progressively worsened to severe 2 days ago. Pain is constant, worse with movement, severe, non radiating with associated nausea and diarrhea. No fever, chills, vomiting, SOB or other associated symptoms. Pt denies hx of abdominal surgeries, no anticoagulation, and no daily meds. No known allergies. Last meal last night.   ROS:  Review of Systems  Constitutional: Negative for chills, diaphoresis and fever.  HENT: Negative for sore throat.   Respiratory: Negative for cough and shortness of breath.   Cardiovascular: Negative for chest pain.  Gastrointestinal: Positive for abdominal pain, diarrhea and nausea. Negative for blood in stool, constipation and vomiting.  Genitourinary: Negative for dysuria.  Skin: Negative for rash.  Neurological: Negative for dizziness and loss of consciousness.  All other systems reviewed and are negative.    No family history on file.  No past medical history on file.  No past surgical history on file.  Social History:  reports that he has been smoking. He has been smoking about 1.00 pack per day. He has never used smokeless tobacco. He reports that he does not drink alcohol or use drugs.  Allergies: No Known Allergies  (Not in a hospital admission)   Blood pressure 135/79, pulse 81, temperature 98.9 F (37.2 C), temperature source Oral, resp. rate 15, height '5\' 10"'  (1.778 m), weight 63.5 kg, SpO2 100 %.  Physical Exam Constitutional:      General: He is not in acute distress.    Appearance: Normal appearance. He is normal weight. He is not diaphoretic.  HENT:     Head: Normocephalic and  atraumatic.     Nose: Nose normal.     Mouth/Throat:     Comments: Pt wearing mask Eyes:     General: No scleral icterus.       Right eye: No discharge.        Left eye: No discharge.     Conjunctiva/sclera: Conjunctivae normal.     Pupils: Pupils are equal, round, and reactive to light.  Neck:     Musculoskeletal: Normal range of motion and neck supple.  Cardiovascular:     Rate and Rhythm: Normal rate and regular rhythm.     Pulses:          Radial pulses are 2+ on the right side and 2+ on the left side.       Posterior tibial pulses are 2+ on the right side and 2+ on the left side.     Heart sounds: Normal heart sounds. No murmur.  Pulmonary:     Effort: Pulmonary effort is normal. No respiratory distress.     Breath sounds: Normal breath sounds. No wheezing, rhonchi or rales.  Abdominal:     General: Bowel sounds are normal. There is no distension.     Palpations: Abdomen is soft. Abdomen is not rigid.     Tenderness: There is abdominal tenderness in the right lower quadrant. There is guarding. Positive signs include McBurney's sign.  Musculoskeletal: Normal range of motion.        General: No tenderness or deformity.     Right lower leg: No edema.     Left lower  leg: No edema.  Skin:    General: Skin is warm and dry.     Findings: No rash.  Neurological:     Mental Status: He is alert and oriented to person, place, and time.  Psychiatric:        Mood and Affect: Mood normal.        Behavior: Behavior normal.     Results for orders placed or performed during the hospital encounter of 05/13/19 (from the past 48 hour(s))  Lipase, blood     Status: None   Collection Time: 05/13/19  6:57 AM  Result Value Ref Range   Lipase 28 11 - 51 U/L    Comment: Performed at [facility], [place] [address], [place], [place] [address]  Comprehensive metabolic panel     Status: Abnormal   Collection Time: 05/13/19  6:57 AM  Result Value Ref Range   Sodium 136 135 - 145 mmol/L    Potassium 3.4 (L) 3.5 - 5.1 mmol/L   Chloride 99 98 - 111 mmol/L   CO2 24 22 - 32 mmol/L   Glucose, Bld 115 (H) 70 - 99 mg/dL   BUN 12 6 - 20 mg/dL   Creatinine, Ser 0.91 0.61 - 1.24 mg/dL   Calcium 9.3 8.9 - 10.3 mg/dL   Total Protein 7.8 6.5 - 8.1 g/dL   Albumin 4.4 3.5 - 5.0 g/dL   AST 20 15 - 41 U/L   ALT 14 0 - 44 U/L   Alkaline Phosphatase 60 38 - 126 U/L   Total Bilirubin 1.2 0.3 - 1.2 mg/dL   GFR calc non Af Amer >60 >60 mL/min   GFR calc Af Amer >60 >60 mL/min   Anion gap 13 5 - 15    Comment: Performed at [facility], [place] [address], [place], [place] [address]  CBC     Status: Abnormal   Collection Time: 05/13/19  6:57 AM  Result Value Ref Range   WBC 12.3 (H) 4.0 - 10.5 K/uL   RBC 4.80 4.22 - 5.81 MIL/uL   Hemoglobin 14.8 13.0 - 17.0 g/dL   HCT 44.3 39.0 - 52.0 %   MCV 92.3 80.0 - 100.0 fL   MCH 30.8 26.0 - 34.0 pg   MCHC 33.4 30.0 - 36.0 g/dL   RDW 11.6 11.5 - 15.5 %   Platelets 284 150 - 400 K/uL   nRBC 0.0 0.0 - 0.2 %    Comment: Performed at [facility], [place] [address] [place], [place] [address]  POC SARS Coronavirus 2 Ag-ED - Nasal Swab (BD Veritor Kit)     Status: None   Collection Time: 05/13/19 10:05 AM  Result Value Ref Range   SARS Coronavirus 2 Ag NEGATIVE NEGATIVE    Comment: (NOTE) SARS-CoV-2 antigen NOT DETECTED.  Negative results are presumptive.  Negative results do not preclude SARS-CoV-2 infection and should not be used as the sole basis for treatment or other patient management decisions, including infection  control decisions, particularly in the presence of clinical signs and  symptoms consistent with COVID-19, or in those who have been in contact with the virus.  Negative results must be combined with clinical observations, patient history, and epidemiological information. The expected result is Negative. Fact Sheet for Patients: PodPark.tn Fact Sheet for Healthcare  Providers: GiftContent.is This test is not yet approved or cleared by the [place] FDA and  has been authorized for detection and/or diagnosis of SARS-CoV-2 by FDA under  an Emergency Use Authorization (EUA).  This EUA will remain in effect (meaning this test can be used) for the duration of  the COVID-19 de claration under Section 564(b)(1) of the Act, 21 U.S.C. section 360bbb-3(b)(1), unless the authorization is terminated or revoked sooner.    Ct Abdomen Pelvis W Contrast  Result Date: 05/13/2019 CLINICAL DATA:  Lower abdominal pain EXAM: CT ABDOMEN AND PELVIS WITH CONTRAST TECHNIQUE: Multidetector CT imaging of the abdomen and pelvis was performed using the standard protocol following bolus administration of intravenous contrast. CONTRAST:  100 mL OMNIPAQUE IOHEXOL 300 MG/ML  SOLN COMPARISON:  None. FINDINGS: Lower chest: Lung bases are clear. Hepatobiliary: No focal liver lesions are evident. Gallbladder wall is not appreciably thickened. There is no biliary duct dilatation. Pancreas: There is no pancreatic mass or inflammatory focus. Spleen: No splenic lesions are evident. Adrenals/Urinary Tract: Adrenals bilaterally appear normal. There is a 4 mm probable cyst in the left kidney. There is a 5 mm cyst in the medial lower pole of the right kidney. No evident hydronephrosis on either side. There are no appreciable intrarenal calculi. There is contrast in the collecting systems which may obscure smaller calculi. There appears to be medullary sponge kidney bilaterally. No ureteral calculi are evident on either side. Urinary bladder is midline with wall thickness within normal limits. Stomach/Bowel: There is no appreciable bowel wall or mesenteric thickening. There is no evident bowel obstruction. The terminal ileum appears unremarkable. No free air or portal venous air is evident. Vascular/Lymphatic: There is no abdominal aortic aneurysm. No vascular lesions are  evident. No adenopathy is appreciable in the abdomen or pelvis. Reproductive: Prostate and seminal vesicles are normal in size and contour. No pelvic mass evident. Other: Appendix measures 1.5 cm in diameter. There is and appendicular lith within the appendix measuring 2.9 cm in length. There is soft tissue stranding adjacent to the appendix. There is no appendiceal abscess or perforation evident. There is no abscess in the abdomen or pelvis. There is mild ascites in the dependent portion of the pelvis. Musculoskeletal: No blastic or lytic bone lesions. No intramuscular lesions are evident period no abdominal wall lesions evident. IMPRESSION: 1.  Findings indicative of acute appendicitis. Appendix: Location: Appendix arises inferior to the cecum and is located in the lateral right upper pelvis slightly inferior to the superior aspect of the right iliac crest. Diameter: 1.5 cm Appendicolith: Present, measuring 2.9 cm in length. Mucosal hyper-enhancement: Mild Extraluminal gas: None Periappendiceal collection: None. There is soft tissue stranding adjacent to the appendix. 2. Suspect medullary sponge kidney bilaterally. No obstructing focus in either kidney or ureter. Urinary bladder wall thickness normal. 3.  No bowel obstruction.  No abscess in the abdomen or pelvis. 4.  Mild ascites in the dependent portion of the pelvis. Critical Value/emergent results were called by telephone at the time of interpretation on 05/13/2019 at 9:29 am to [place] , who verbally acknowledged these results. Electronically Signed   By: Lowella Grip III M.D.   On: 05/13/2019 09:29      Assessment/Plan Active Problems:   * No active hospital problems. *  Tobacco abuse  appendicitis - OR today for laparoscopic appendectomy  FEN: NPO, IVF VTE: SCD's, lovenox on hold 2/2 surgery ID: Zosyn 11/25 Foley: none Follow up: TBD  Plan: OR today for lap appy   Kalman Drape, [facility]  Surgery 05/13/2019, 10:57 AM Please see amion for pager for the following: M, T, W, & Friday 7:00am - 4:30pm Thursdays  7:00am -11:30am

## 2019-05-13 NOTE — Transfer of Care (Signed)
Immediate Anesthesia Transfer of Care Note  Patient: Andres Weaver  Procedure(s) Performed: APPENDECTOMY LAPAROSCOPIC (N/A Abdomen)  Patient Location: PACU  Anesthesia Type:General  Level of Consciousness: drowsy  Airway & Oxygen Therapy: Patient Spontanous Breathing and Patient connected to face mask oxygen  Post-op Assessment: Report given to RN and Post -op Vital signs reviewed and stable  Post vital signs: Reviewed and stable  Last Vitals:  Vitals Value Taken Time  BP 101/53 05/13/19 1501  Temp 37.4 C 05/13/19 1500  Pulse 95 05/13/19 1503  Resp 10 05/13/19 1503  SpO2 98 % 05/13/19 1503  Vitals shown include unvalidated device data.  Last Pain:  Vitals:   05/13/19 1102  TempSrc:   PainSc: 10-Worst pain ever         Complications: No apparent anesthesia complications

## 2019-05-13 NOTE — ED Provider Notes (Signed)
[facility] EMERGENCY DEPARTMENT Provider Note   CSN: [id number] Arrival date & time: 05/13/19  0630     History   Chief Complaint Chief Complaint  Patient presents with   Abdominal Pain    HPI Andres Weaver is a 33 y.o. male.     Patient is a 33 year old male with no past medical history presenting to the emergency department for 4 days of worsening abdominal pain, nausea, diarrhea.  Patient reports that the pain is constant but waxes and wanes with no specific exacerbating or relieving factors.  Reports that he tried to take Pepto-Bismol but this seemed to make it worse.  Reports that he has had multiple episodes of diarrhea which is nonbloody.  Reports feeling nauseous without vomiting.  Reports decreased appetite.  Denies any fever, dysuria, cough or URI symptoms.  Denies any sick contacts.     History reviewed. No pertinent past medical history.  There are no active problems to display for this patient.   Past Surgical History:  Procedure Laterality Date   LAPAROSCOPIC APPENDECTOMY N/A 05/13/2019   Procedure: APPENDECTOMY LAPAROSCOPIC;  Surgeon: Berna Bue, MD;  Location: [facility] OR;  Service: General;  Laterality: N/A;        Home Medications    Prior to Admission medications   Medication Sig Start Date End Date Taking? Authorizing Provider  acetaminophen (TYLENOL) 500 MG tablet Take 1 tablet (500 mg total) by mouth every 6 (six) hours as needed for mild pain or fever. 05/13/19   Rayburn, Alphonsus Sias, PA-C  ibuprofen (MOTRIN IB) 200 MG tablet Take 3 tablets (600 mg total) by mouth every 6 (six) hours as needed for moderate pain. 05/13/19   Rayburn, Alphonsus Sias, PA-C  traMADol (ULTRAM) 50 MG tablet Take 1 tablet (50 mg total) by mouth every 6 (six) hours as needed for severe pain. 05/13/19   Rayburn, Alphonsus Sias, PA-C    Family History History reviewed. No pertinent family history.  Social History Social History   Tobacco Use   Smoking status:  Current Every Day Smoker    Packs/day: 1.00   Smokeless tobacco: Never Used  Substance Use Topics   Alcohol use: No   Drug use: No     Allergies   Iodine   Review of Systems Review of Systems  Constitutional: Positive for appetite change. Negative for chills and fever.  HENT: Negative for congestion and sore throat.   Respiratory: Negative for cough and shortness of breath.   Cardiovascular: Negative for chest pain.  Gastrointestinal: Positive for abdominal pain, diarrhea and nausea. Negative for anal bleeding, constipation, rectal pain and vomiting.  Endocrine: Negative for polyuria.  Genitourinary: Negative for discharge, dysuria and testicular pain.  Musculoskeletal: Negative for back pain and myalgias.  Skin: Negative for rash.  Neurological: Positive for light-headedness. Negative for dizziness.     Physical Exam Updated Vital Signs BP 110/74 (BP Location: Left Arm)    Pulse 82    Temp 99 F (37.2 C)    Resp 18    Ht  (1.778 m)    Wt 63.5 kg    SpO2 96%    BMI 20.09 kg/m   Physical Exam Vitals signs and nursing note reviewed.  Constitutional:      General: He is not in acute distress.    Appearance: Normal appearance. He is not ill-appearing, toxic-appearing or diaphoretic.  HENT:     Head: Normocephalic.  Eyes:     Conjunctiva/sclera: Conjunctivae normal.  Cardiovascular:  Rate and Rhythm: Normal rate and regular rhythm.     Heart sounds: Normal heart sounds.  Pulmonary:     Effort: Pulmonary effort is normal.  Abdominal:     General: Abdomen is flat. Bowel sounds are normal.     Palpations: Abdomen is soft.     Tenderness: There is generalized abdominal tenderness and tenderness in the right lower quadrant. There is guarding. There is no right CVA tenderness or left CVA tenderness. Negative signs include Murphy's sign and McBurney's sign.  Skin:    General: Skin is dry.  Neurological:     Mental Status: He is alert.  Psychiatric:        Mood  and Affect: Mood normal.      ED Treatments / Results  Labs (all labs ordered are listed, but only abnormal results are displayed) Labs Reviewed  COMPREHENSIVE METABOLIC PANEL - Abnormal; Notable for the following components:      Result Value   Potassium 3.4 (*)    Glucose, Bld 115 (*)    All other components within normal limits  CBC - Abnormal; Notable for the following components:   WBC 12.3 (*)    All other components within normal limits  URINALYSIS, ROUTINE W REFLEX MICROSCOPIC - Abnormal; Notable for the following components:   Specific Gravity, Urine 1.032 (*)    Ketones, ur 20 (*)    All other components within normal limits  SARS CORONAVIRUS 2 BY RT PCR (HOSPITAL ORDER, PERFORMED IN [facility])  LIPASE, BLOOD  POC SARS CORONAVIRUS 2 AG -  ED  SURGICAL PATHOLOGY    EKG None  Radiology Ct Abdomen Pelvis W Contrast  Result Date: 05/13/2019 CLINICAL DATA:  Lower abdominal pain EXAM: CT ABDOMEN AND PELVIS WITH CONTRAST TECHNIQUE: Multidetector CT imaging of the abdomen and pelvis was performed using the standard protocol following bolus administration of intravenous contrast. CONTRAST:  100 mL OMNIPAQUE IOHEXOL 300 MG/ML  SOLN COMPARISON:  None. FINDINGS: Lower chest: Lung bases are clear. Hepatobiliary: No focal liver lesions are evident. Gallbladder wall is not appreciably thickened. There is no biliary duct dilatation. Pancreas: There is no pancreatic mass or inflammatory focus. Spleen: No splenic lesions are evident. Adrenals/Urinary Tract: Adrenals bilaterally appear normal. There is a 4 mm probable cyst in the left kidney. There is a 5 mm cyst in the medial lower pole of the right kidney. No evident hydronephrosis on either side. There are no appreciable intrarenal calculi. There is contrast in the collecting systems which may obscure smaller calculi. There appears to be medullary sponge kidney bilaterally. No ureteral calculi are evident on either side.  Urinary bladder is midline with wall thickness within normal limits. Stomach/Bowel: There is no appreciable bowel wall or mesenteric thickening. There is no evident bowel obstruction. The terminal ileum appears unremarkable. No free air or portal venous air is evident. Vascular/Lymphatic: There is no abdominal aortic aneurysm. No vascular lesions are evident. No adenopathy is appreciable in the abdomen or pelvis. Reproductive: Prostate and seminal vesicles are normal in size and contour. No pelvic mass evident. Other: Appendix measures 1.5 cm in diameter. There is and appendicular lith within the appendix measuring 2.9 cm in length. There is soft tissue stranding adjacent to the appendix. There is no appendiceal abscess or perforation evident. There is no abscess in the abdomen or pelvis. There is mild ascites in the dependent portion of the pelvis. Musculoskeletal: No blastic or lytic bone lesions. No intramuscular lesions are evident period no abdominal  wall lesions evident. IMPRESSION: 1.  Findings indicative of acute appendicitis. Appendix: Location: Appendix arises inferior to the cecum and is located in the lateral right upper pelvis slightly inferior to the superior aspect of the right iliac crest. Diameter: 1.5 cm Appendicolith: Present, measuring 2.9 cm in length. Mucosal hyper-enhancement: Mild Extraluminal gas: None Periappendiceal collection: None. There is soft tissue stranding adjacent to the appendix. 2. Suspect medullary sponge kidney bilaterally. No obstructing focus in either kidney or ureter. Urinary bladder wall thickness normal. 3.  No bowel obstruction.  No abscess in the abdomen or pelvis. 4.  Mild ascites in the dependent portion of the pelvis. Critical Value/emergent results were called by telephone at the time of interpretation on 05/13/2019 at 9:29 am to [facility] , who verbally acknowledged these results. Electronically Signed   By: Bretta BangWilliam  Woodruff III M.D.   On: 05/13/2019  09:29    Procedures Procedures (including critical care time)  Medications Ordered in ED Medications  ketorolac (TORADOL) 15 MG/ML injection 15 mg (15 mg Intravenous Given 05/13/19 0718)  ondansetron (ZOFRAN) injection 4 mg (4 mg Intravenous Given 05/13/19 0735)  iohexol (OMNIPAQUE) 300 MG/ML solution 80 mL (80 mLs Intravenous Contrast Given 05/13/19 0914)  morphine 4 MG/ML injection 4 mg (4 mg Intravenous Given 05/13/19 0942)  sodium chloride 0.9 % bolus 1,000 mL (0 mLs Intravenous Stopped 05/13/19 1058)  piperacillin-tazobactam (ZOSYN) IVPB 3.375 g (0 g Intravenous Stopped 05/13/19 1048)  celecoxib (CELEBREX) capsule 400 mg (400 mg Oral Given 05/13/19 1237)  acetaminophen (TYLENOL) tablet 1,000 mg (1,000 mg Oral Given 05/13/19 1237)  traMADol (ULTRAM) tablet 50 mg (50 mg Oral Given 05/13/19 1554)  propofol (DIPRIVAN) 10 mg/mL bolus/IV push (has no administration in time range)  fentaNYL (SUBLIMAZE) 250 MCG/5ML injection (has no administration in time range)  midazolam (VERSED) 2 MG/2ML injection (has no administration in time range)  succinylcholine (ANECTINE) 200 MG/10ML syringe (has no administration in time range)  lidocaine 20 MG/ML injection (has no administration in time range)  rocuronium bromide 100 MG/10ML SOSY (has no administration in time range)  phenylephrine 0.4-0.9 MG/10ML-% injection (has no administration in time range)  dexamethasone (DECADRON) 10 MG/ML injection (has no administration in time range)  ondansetron (ZOFRAN) 4 MG/2ML injection (has no administration in time range)  succinylcholine (ANECTINE) 200 MG/10ML syringe (has no administration in time range)  lidocaine 20 MG/ML injection (has no administration in time range)  rocuronium bromide 100 MG/10ML SOSY (has no administration in time range)  dexamethasone (DECADRON) 10 MG/ML injection (has no administration in time range)  ondansetron (ZOFRAN) 4 MG/2ML injection (has no administration in time range)    bupivacaine (PF) (MARCAINE) 0.25 % injection (has no administration in time range)     Initial Impression / Assessment and Plan / ED Course  I have reviewed the triage vital signs and the nursing notes.  Pertinent labs & imaging results that were available during my care of the patient were reviewed by me and considered in my medical decision making (see chart for details).  Clinical Course as of May 13 722  Wed May 13, 2019  0800 Patient presenting with anorexia, nausea and abdominal pain x4 days. TTP in RLQ on exam. Concern for appendicitis. CT, labs, UA, toradol and zofran ordered   [KM]  0930 Spoke with radiologist who confirmed acute appendicitis. There is soft tissue stranding adjacent to the appendix. No perforation or abscess. Patient stable, afebrile. Paging general surgery and ordering rapid covid, fluids, morphine   [KM]  1023 I spoke with Claiborne Billings from General surgery and the patient will be admitted.    [KM]    Clinical Course User Index [KM] Alveria Apley, PA-C         Final Clinical Impressions(s) / ED Diagnoses   Final diagnoses:  Acute appendicitis with localized peritonitis, without perforation, abscess, or gangrene    ED Discharge Orders         Ordered    acetaminophen (TYLENOL) 500 MG tablet  Every 6 hours PRN     05/13/19 1456    ibuprofen (MOTRIN IB) 200 MG tablet  Every 6 hours PRN     05/13/19 1456    traMADol (ULTRAM) 50 MG tablet  Every 6 hours PRN     05/13/19 1456           Kristine Royal 05/14/19 5625    Pattricia Boss, MD 05/15/19 [phone]

## 2019-05-13 NOTE — ED Notes (Signed)
Report given to [facility], RN in short stay

## 2019-05-13 NOTE — Anesthesia Postprocedure Evaluation (Signed)
Anesthesia Post Note  Patient: Andres Weaver  Procedure(s) Performed: APPENDECTOMY LAPAROSCOPIC (N/A Abdomen)     Patient location during evaluation: PACU Anesthesia Type: General Level of consciousness: awake and alert Pain management: pain level controlled Vital Signs Assessment: post-procedure vital signs reviewed and stable Respiratory status: spontaneous breathing, nonlabored ventilation and respiratory function stable Cardiovascular status: blood pressure returned to baseline and stable Postop Assessment: no apparent nausea or vomiting Anesthetic complications: no    Last Vitals:  Vitals:   05/13/19 1530 05/13/19 1545  BP: (!) 99/57 112/68  Pulse: 88 89  Resp: 20 18  Temp:    SpO2: 94% 95%    Last Pain:  Vitals:   05/13/19 1530  TempSrc:   PainSc: 0-No pain                 Carmen Vallecillo,W. EDMOND

## 2019-05-13 NOTE — ED Triage Notes (Signed)
Pt presents to ED from home POV. Pt c/o intermitten LRQ abdominal pain. Pt reports it began 3-4 days ago but worsened this morning. Pt states he has had loose stools for 4 days. Pt reports typical PO intake.

## 2019-05-13 NOTE — Op Note (Signed)
Operative Report  Andres Weaver 33 y.o. male  [id number]  [id number]  05/13/2019  Surgeon: Clovis Riley   Assistant: none  Procedure performed: Laparoscopic Appendectomy  Preop diagnosis: Acute appendicitis  Post-op diagnosis/intraop findings: Acute appendicitis  Specimens: appendix  EBL: minimal  Complications: none  Description of procedure: After obtaining informed consent the patient was brought to the operating room. Antibiotics and subcutaneous heparin were administered. SCD's were applied. General endotracheal anesthesia was initiated and a formal time-out was performed. The abdomen was prepped and draped in the usual sterile fashion and the abdomen was entered using an infraumbilical Veress needle and insufflated to 15 mmHg. A 5 mm trocar and camera were then introduced, the abdomen was inspected and there is no evidence of injury from our entry. A suprapubic 5 mm trocar and a left lower quadrant 12 mm trocar were introduced under direct visualization following infiltration with local. The patient was then placed in Trendelenburg and rotated to the left and the small bowel was reflected cephalad. The appendix was visualized: it is partially retrocecal with omental inflammatory adhesions. Purulent fluid in the pelvis. A combination of blunt dissection and hook electrocautery were used to free it of its retroperitoneal attachments. Great care was taken to ensure no injury to surrounding retroperitoneal structures, cecum or terminal ileum. A window was created at the base of the appendix and a white load linear cutting stapler was used to transect the appendix from the cecum. Harmonic scalpel was then used to transect the appendiceal mesentery. Hemostasis was ensured. The appendix was placed in an Endo Catch bag and removed through our 12 mm trocar site. The pus in the pelvis was aspirated and the pelvis and right lower quadrant were irrigated, the effluent was clear.  The  small bowel was run from the ileocecal valve proximally several feet with no other abnormalities identified.  The staple line was reinspected, it is hemostatic and healthy appearing.  The omentum was brought back down to cover the staple line.  The 91mm trocar site in the left lower quadrant was closed with a 0 vicryl in the fascia under direct visualization using a PMI device. The abdomen was desufflated and all trocars removed. The skin incisions were closed with subcuticular monocryl and Dermabond. The patient was awakened, extubated and transported to the recovery room in stable condition.   All counts were correct at the completion of the case.

## 2019-05-13 NOTE — ED Notes (Signed)
Pt is NSR on monitor 

## 2019-05-13 NOTE — Discharge Instructions (Signed)
[facility] LAPAROSCOPIC SURGERY: POST OP INSTRUCTIONS Always review your discharge instruction sheet given to you by the facility where your surgery was performed. IF YOU HAVE DISABILITY OR FAMILY LEAVE FORMS, YOU MUST BRING THEM TO THE OFFICE FOR PROCESSING.   DO NOT GIVE THEM TO YOUR DOCTOR.  PAIN CONTROL  1. First take acetaminophen (Tylenol) AND/or ibuprofen (Advil) to control your pain after surgery.  Follow directions on package.  Taking acetaminophen (Tylenol) and/or ibuprofen (Advil) regularly after surgery will help to control your pain and lower the amount of prescription pain medication you may need.  You should not take more than 3,000 mg (3 grams) of acetaminophen (Tylenol) in 24 hours.  You should not take ibuprofen (Advil), aleve, motrin, naprosyn or other NSAIDS if you have a history of stomach ulcers or chronic kidney disease.  2. A prescription for pain medication may be given to you upon discharge.  Take your pain medication as prescribed, if you still have uncontrolled pain after taking acetaminophen (Tylenol) or ibuprofen (Advil). 3. Use ice packs to help control pain. 4. If you need a refill on your pain medication, please contact your pharmacy.  They will contact our office to request authorization. Prescriptions will not be filled after 5pm or on week-ends.  HOME MEDICATIONS 5. Take your usually prescribed medications unless otherwise directed.  DIET 6. You should follow a light diet the first few days after arrival home.  Be sure to include lots of fluids daily. Avoid fatty, fried foods.   CONSTIPATION 7. It is common to experience some constipation after surgery and if you are taking pain medication.  Increasing fluid intake and taking a stool softener (such as Colace) will usually help or prevent this problem from occurring.  A mild laxative (Milk of Magnesia or Miralax) should be taken according to package instructions if there are no bowel  movements after 48 hours.  WOUND/INCISION CARE 8. Most patients will experience some swelling and bruising in the area of the incisions.  Ice packs will help.  Swelling and bruising can take several days to resolve.  9. Unless discharge instructions indicate otherwise, follow guidelines below  a. STERI-STRIPS - you may remove your outer bandages 48 hours after surgery, and you may shower at that time.  You have steri-strips (small skin tapes) in place directly over the incision.  These strips should be left on the skin for 7-10 days.   b. DERMABOND/SKIN GLUE - you may shower in 24 hours.  The glue will flake off over the next 2-3 weeks. 10. Any sutures or staples will be removed at the office during your follow-up visit.  ACTIVITIES 11. You may resume regular (light) daily activities beginning the next day--such as daily self-care, walking, climbing stairs--gradually increasing activities as tolerated.  You may have sexual intercourse when it is comfortable.  Refrain from any heavy lifting or straining until approved by your doctor. a. You may drive when you are no longer taking prescription pain medication, you can comfortably wear a seatbelt, and you can safely maneuver your car and apply brakes.  FOLLOW-UP 12. You should see your doctor in the office for a follow-up appointment approximately 2-3 weeks after your surgery.  You should have been given your post-op/follow-up appointment when your surgery was scheduled.  If you did not receive a post-op/follow-up appointment, make sure that you call for this appointment within a day or two after you arrive home to insure a convenient appointment time.     WHEN TO CALL YOUR DOCTOR: 1. Fever over 101.0 2. Inability to urinate 3. Continued bleeding from incision. 4. Increased pain, redness, or drainage from the incision. 5. Increasing abdominal pain  The clinic staff is available to answer your questions during regular business hours.  Please don't  hesitate to call and ask to speak to one of the nurses for clinical concerns.  If you have a medical emergency, go to the nearest emergency room or call 911.  A surgeon from [facility] is always on call at the hospital. [address], [address], [place], [place]  [address] ? [address] [address], [place],    [address] [phone] ? [phone] ? FAX [phone] Web site: [email or web address]  .........   Managing Your Pain After Surgery Without Opioids    Thank you for participating in our program to help patients manage their pain after surgery without opioids. This is part of our effort to provide you with the best care possible, without exposing you or your family to the risk that opioids pose.  What pain can I expect after surgery? You can expect to have some pain after surgery. This is normal. The pain is typically worse the day after surgery, and quickly begins to get better. Many studies have found that many patients are able to manage their pain after surgery with Over-the-Counter (OTC) medications such as Tylenol and Motrin. If you have a condition that does not allow you to take Tylenol or Motrin, notify your surgical team.  How will I manage my pain? The best strategy for controlling your pain after surgery is around the clock pain control with Tylenol (acetaminophen) and Motrin (ibuprofen or Advil). Alternating these medications with each other allows you to maximize your pain control. In addition to Tylenol and Motrin, you can use heating pads or ice packs on your incisions to help reduce your pain.  How will I alternate your regular strength over-the-counter pain medication? You will take a dose of pain medication every three hours. ; Start by taking 650 mg of Tylenol (2 pills of 325 mg) ; 3 hours later take 600 mg of Motrin (3 pills of 200 mg) ; 3 hours after taking the Motrin take 650 mg of Tylenol ; 3 hours after that take 600 mg of  Motrin.   - 1 -  See example - if your first dose of Tylenol is at 12:00 PM   12:00 PM Tylenol 650 mg (2 pills of 325 mg)  3:00 PM Motrin 600 mg (3 pills of 200 mg)  6:00 PM Tylenol 650 mg (2 pills of 325 mg)  9:00 PM Motrin 600 mg (3 pills of 200 mg)  Continue alternating every 3 hours   We recommend that you follow this schedule around-the-clock for at least 3 days after surgery, or until you feel that it is no longer needed. Use the table on the last page of this handout to keep track of the medications you are taking. Important: Do not take more than 3000mg of Tylenol or 3200mg of Motrin in a 24-hour period. Do not take ibuprofen/Motrin if you have a history of bleeding stomach ulcers, severe kidney disease, &/or actively taking a blood thinner  What if I still have pain? If you have pain that is not controlled with the over-the-counter pain medications (Tylenol and Motrin or Advil) you might have what we call "breakthrough" pain. You will receive a prescription for a small amount of an opioid pain medication such as   Oxycodone, Tramadol, or Tylenol with Codeine. Use these opioid pills in the first 24 hours after surgery if you have breakthrough pain. Do not take more than 1 pill every 4-6 hours.  If you still have uncontrolled pain after using all opioid pills, don't hesitate to call our staff using the number provided. We will help make sure you are managing your pain in the best way possible, and if necessary, we can provide a prescription for additional pain medication.   Day 1    Time  Name of Medication Number of pills taken  Amount of Acetaminophen  Pain Level   Comments  AM PM       AM PM       AM PM       AM PM       AM PM       AM PM       AM PM       AM PM       Total Daily amount of Acetaminophen Do not take more than  3,000 mg per day      Day 2    Time  Name of Medication Number of pills taken  Amount of Acetaminophen  Pain Level   Comments  AM  PM       AM PM       AM PM       AM PM       AM PM       AM PM       AM PM       AM PM       Total Daily amount of Acetaminophen Do not take more than  3,000 mg per day      Day 3    Time  Name of Medication Number of pills taken  Amount of Acetaminophen  Pain Level   Comments  AM PM       AM PM       AM PM       AM PM          AM PM       AM PM       AM PM       AM PM       Total Daily amount of Acetaminophen Do not take more than  3,000 mg per day      Day 4    Time  Name of Medication Number of pills taken  Amount of Acetaminophen  Pain Level   Comments  AM PM       AM PM       AM PM       AM PM       AM PM       AM PM       AM PM       AM PM       Total Daily amount of Acetaminophen Do not take more than  3,000 mg per day      Day 5    Time  Name of Medication Number of pills taken  Amount of Acetaminophen  Pain Level   Comments  AM PM       AM PM       AM PM       AM PM       AM PM       AM PM       AM PM         AM PM       Total Daily amount of Acetaminophen Do not take more than  3,000 mg per day       Day 6    Time  Name of Medication Number of pills taken  Amount of Acetaminophen  Pain Level  Comments  AM PM       AM PM       AM PM       AM PM       AM PM       AM PM       AM PM       AM PM       Total Daily amount of Acetaminophen Do not take more than  3,000 mg per day      Day 7    Time  Name of Medication Number of pills taken  Amount of Acetaminophen  Pain Level   Comments  AM PM       AM PM       AM PM       AM PM       AM PM       AM PM       AM PM       AM PM       Total Daily amount of Acetaminophen Do not take more than  3,000 mg per day        For additional information about how and where to safely dispose of unused opioid medications - https://www.morepowerfulnc.org  Disclaimer: This document contains information and/or instructional materials adapted from [facility]  for the typical patient with your condition. It does not replace medical advice from your health care provider because your experience may differ from that of the typical patient. Talk to your health care provider if you have any questions about this document, your condition or your treatment plan. Adapted from [facility]  

## 2019-05-13 NOTE — ED Notes (Signed)
Patient transported to CT 

## 2019-05-14 ENCOUNTER — Encounter (HOSPITAL_COMMUNITY): Payer: Self-pay | Admitting: Surgery

## 2019-05-15 LAB — SURGICAL PATHOLOGY

## 2019-07-18 ENCOUNTER — Other Ambulatory Visit: Payer: Self-pay

## 2019-07-18 ENCOUNTER — Ambulatory Visit (HOSPITAL_COMMUNITY)
Admission: EM | Admit: 2019-07-18 | Discharge: 2019-07-18 | Disposition: A | Discharge status: Home / Self Care | Payer: Self-pay | Attending: Family Medicine | Admitting: Family Medicine

## 2019-07-18 ENCOUNTER — Encounter (HOSPITAL_COMMUNITY): Payer: Self-pay

## 2019-07-18 DIAGNOSIS — Z202 Contact with and (suspected) exposure to infections with a predominantly sexual mode of transmission: Secondary | ICD-10-CM

## 2019-07-18 MED ORDER — CEFTRIAXONE SODIUM 500 MG IJ SOLR
500.0000 mg | Freq: Once | INTRAMUSCULAR | Status: AC
  Administered 2019-07-18: 16:00:00 500 mg via INTRAMUSCULAR

## 2019-07-18 MED ORDER — DOXYCYCLINE HYCLATE 100 MG PO CAPS: 100.0000 mg | ORAL_CAPSULE | Freq: Two times a day (BID) | ORAL | 0 refills | Status: DC

## 2019-07-18 MED ORDER — CEFTRIAXONE SODIUM 500 MG IJ SOLR
INTRAMUSCULAR | Status: AC
  Filled 2019-07-18: qty 500

## 2019-07-18 NOTE — ED Triage Notes (Signed)
Pt states he needed to be treated for Chlamydia. Pt was told by his partner.

## 2019-07-18 NOTE — Discharge Instructions (Signed)
You have been given the following today for treatment of suspected gonorrhea and/or chlamydia:  cefTRIAXone (ROCEPHIN) injection 500 mg  Please pick up your prescription for doxycycline 100 mg and begin taking twice daily for the next seven (7) days.  Please refrain from all sexual activity for at least the next seven days.  

## 2019-07-20 NOTE — ED Provider Notes (Signed)
[facility]   [id number] 07/18/19 Arrival Time: 1531  ASSESSMENT & PLAN:  1. STD exposure       Discharge Instructions     You have been given the following today for treatment of suspected gonorrhea and/or chlamydia:  cefTRIAXone (ROCEPHIN) injection 500 mg  Please pick up your prescription for doxycycline 100 mg and begin taking twice daily for the next seven (7) days.  Please refrain from all sexual activity for at least the next seven days.     Declines STD testing at this time. Instructed to refrain from sexual activity for at least seven days.  Reviewed expectations re: course of current medical issues. Questions answered. Outlined signs and symptoms indicating need for more acute intervention. Patient verbalized understanding. After Visit Summary given.   SUBJECTIVE:  Andres Weaver is a 34 y.o. male who reports exposure to STD. Partner told him of positive chlamydia test. He reports no symptoms currently. Denies: urinary frequency, dysuria and gross hematuria. No penile discharge. Afebrile. No abdominal or pelvic pain. No n/v. No rashes or lesions. Reports that he is sexually active with single male partner. OTC treatment: none. History of STI: none reported.   OBJECTIVE:  Vitals:   07/18/19 1553 07/18/19 1555  BP: 132/81   Pulse: 93   Resp: 16   Temp: 98.4 F (36.9 C)   TempSrc: Oral   SpO2: 100%   Weight:  79.3 kg     General appearance: alert, cooperative, appears stated age and no distress Throat: lips, mucosa, and tongue normal; teeth and gums normal CV: RRR Lungs: CTAB Back: no CVA tenderness; FROM at waist Abdomen: soft, non-tender GU: normal appearing genitalia Skin: warm and dry Psychological: alert and cooperative; normal mood and affect.     Allergies  Allergen Reactions  . Iodine Nausea And Vomiting    History reviewed. No pertinent past medical history.   History reviewed. No pertinent family history.    Social History   Socioeconomic History  . Marital status: Married    Spouse name: Not on file  . Number of children: Not on file  . Years of education: Not on file  . Highest education level: Not on file  Occupational History  . Not on file  Tobacco Use  . Smoking status: Current Every Day Smoker    Packs/day: 1.00  . Smokeless tobacco: Never Used  Substance and Sexual Activity  . Alcohol use: No  . Drug use: No  . Sexual activity: Not on file  Other Topics Concern  . Not on file  Social History Narrative  . Not on file   Social Determinants of Health   Financial Resource Strain:   . Difficulty of Paying Living Expenses: Not on file  Food Insecurity:   . Worried About Programme researcher, broadcasting/film/video in the Last Year: Not on file  . Ran Out of Food in the Last Year: Not on file  Transportation Needs:   . Lack of Transportation (Medical): Not on file  . Lack of Transportation (Non-Medical): Not on file  Physical Activity:   . Days of Exercise per Week: Not on file  . Minutes of Exercise per Session: Not on file  Stress:   . Feeling of Stress : Not on file  Social Connections:   . Frequency of Communication with Friends and Family: Not on file  . Frequency of Social Gatherings with Friends and Family: Not on file  . Attends Religious Services: Not on file  . Active  Member of Clubs or Organizations: Not on file  . Attends Archivist Meetings: Not on file  . Marital Status: Not on file  Intimate Partner Violence:   . Fear of Current or Ex-Partner: Not on file  . Emotionally Abused: Not on file  . Physically Abused: Not on file  . Sexually Abused: Not on file          Vanessa Kick, MD 07/20/19 [phone]

## 2020-01-27 ENCOUNTER — Ambulatory Visit (HOSPITAL_COMMUNITY)
Admission: EM | Admit: 2020-01-27 | Discharge: 2020-01-27 | Disposition: A | Discharge status: Home / Self Care | Payer: HRSA Program | Attending: Emergency Medicine | Admitting: Emergency Medicine

## 2020-01-27 ENCOUNTER — Other Ambulatory Visit: Payer: Self-pay

## 2020-01-27 ENCOUNTER — Encounter (HOSPITAL_COMMUNITY): Payer: Self-pay

## 2020-01-27 DIAGNOSIS — Z20822 Contact with and (suspected) exposure to covid-19: Secondary | ICD-10-CM | POA: Diagnosis present

## 2020-01-27 LAB — SARS CORONAVIRUS 2 (TAT 6-24 HRS): SARS Coronavirus 2: NEGATIVE

## 2020-01-27 NOTE — ED Provider Notes (Signed)
[facility]    CSN: [id number] Arrival date & time: 01/27/20  0830      History   Chief Complaint Chief Complaint  Patient presents with  . Covid Exposure    HPI Andres Weaver is a 34 y.o. male presenting today for Covid testing.  Patient reports that his girlfriend recently tested positive for Covid, is living in the same household.  He denies any symptoms.  Denies any URI symptoms, denies GI symptoms.  Denies fever chills or body aches.  HPI  History reviewed. No pertinent past medical history.  There are no problems to display for this patient.   Past Surgical History:  Procedure Laterality Date  . LAPAROSCOPIC APPENDECTOMY N/A 05/13/2019   Procedure: APPENDECTOMY LAPAROSCOPIC;  Surgeon: Berna Bue, MD;  Location: [facility] OR;  Service: General;  Laterality: N/A;       Home Medications    Prior to Admission medications   Medication Sig Start Date End Date Taking? Authorizing Provider  acetaminophen (TYLENOL) 500 MG tablet Take 1 tablet (500 mg total) by mouth every 6 (six) hours as needed for mild pain or fever. 05/13/19   Juliet Rude, PA-C  doxycycline (VIBRAMYCIN) 100 MG capsule Take 1 capsule (100 mg total) by mouth 2 (two) times daily. 07/18/19   Mardella Layman, MD  ibuprofen (MOTRIN IB) 200 MG tablet Take 3 tablets (600 mg total) by mouth every 6 (six) hours as needed for moderate pain. 05/13/19   Juliet Rude, PA-C  traMADol (ULTRAM) 50 MG tablet Take 1 tablet (50 mg total) by mouth every 6 (six) hours as needed for severe pain. 05/13/19   Juliet Rude, PA-C    Family History History reviewed. No pertinent family history.  Social History Social History   Tobacco Use  . Smoking status: Current Every Day Smoker    Packs/day: 1.00  . Smokeless tobacco: Never Used  Substance Use Topics  . Alcohol use: No  . Drug use: No     Allergies   Iodine   Review of Systems Review of Systems  Constitutional: Negative for activity  change, appetite change, chills, fatigue and fever.  HENT: Negative for congestion, ear pain, rhinorrhea, sinus pressure, sore throat and trouble swallowing.   Eyes: Negative for discharge and redness.  Respiratory: Negative for cough, chest tightness and shortness of breath.   Cardiovascular: Negative for chest pain.  Gastrointestinal: Negative for abdominal pain, diarrhea, nausea and vomiting.  Musculoskeletal: Negative for myalgias.  Skin: Negative for rash.  Neurological: Negative for dizziness, light-headedness and headaches.     Physical Exam Triage Vital Signs ED Triage Vitals  Enc Vitals Group     BP 01/27/20 0956 124/76     Pulse Rate 01/27/20 0956 88     Resp 01/27/20 0956 16     Temp 01/27/20 0956 98.4 F (36.9 C)     Temp Source 01/27/20 0956 Oral     SpO2 01/27/20 0956 100 %     Weight --      Height --      Head Circumference --      Peak Flow --      Pain Score 01/27/20 0955 0     Pain Loc --      Pain Edu? --      Excl. in GC? --    No data found.  Updated Vital Signs BP 124/76 (BP Location: Right Arm)   Pulse 88   Temp 98.4 F (36.9 C) (Oral)  Resp 16   SpO2 100%   Visual Acuity Right Eye Distance:   Left Eye Distance:   Bilateral Distance:    Right Eye Near:   Left Eye Near:    Bilateral Near:     Physical Exam Vitals and nursing note reviewed.  Constitutional:      Appearance: He is well-developed.     Comments: No acute distress  HENT:     Head: Normocephalic and atraumatic.     Nose: Nose normal.  Eyes:     Conjunctiva/sclera: Conjunctivae normal.  Cardiovascular:     Rate and Rhythm: Normal rate.  Pulmonary:     Effort: Pulmonary effort is normal. No respiratory distress.  Abdominal:     General: There is no distension.  Musculoskeletal:        General: Normal range of motion.     Cervical back: Neck supple.  Skin:    General: Skin is warm and dry.  Neurological:     Mental Status: He is alert and oriented to person,  place, and time.      UC Treatments / Results  Labs (all labs ordered are listed, but only abnormal results are displayed) Labs Reviewed  SARS CORONAVIRUS 2 (TAT 6-24 HRS)    EKG   Radiology No results found.  Procedures Procedures (including critical care time)  Medications Ordered in UC Medications - No data to display  Initial Impression / Assessment and Plan / UC Course  I have reviewed the triage vital signs and the nursing notes.  Pertinent labs & imaging results that were available during my care of the patient were reviewed by me and considered in my medical decision making (see chart for details).     Covid test pending.  Currently asymptomatic.  Recommend retesting if developing symptoms in the next 2 weeks.  Discussed strict return precautions. Patient verbalized understanding and is agreeable with plan.  Final Clinical Impressions(s) / UC Diagnoses   Final diagnoses:  Exposure to COVID-19 virus  Encounter for laboratory testing for COVID-19 virus     Discharge Instructions     Monitor MyChart for results   ED Prescriptions    None     PDMP not reviewed this encounter.   Lew Dawes, PA-C 01/27/20 1009

## 2020-01-27 NOTE — Discharge Instructions (Signed)
Monitor MyChart for results °

## 2020-01-27 NOTE — ED Triage Notes (Signed)
Pt presents for COVID testing after exposure at work. Pt denies any symptoms.

## 2020-04-14 ENCOUNTER — Other Ambulatory Visit: Payer: Self-pay

## 2020-04-14 ENCOUNTER — Encounter (HOSPITAL_COMMUNITY): Payer: Self-pay | Admitting: Emergency Medicine

## 2020-04-14 ENCOUNTER — Emergency Department (HOSPITAL_COMMUNITY): Payer: Self-pay

## 2020-04-14 ENCOUNTER — Emergency Department (HOSPITAL_COMMUNITY)
Admission: EM | Admit: 2020-04-14 | Discharge: 2020-04-14 | Disposition: A | Discharge status: Home / Self Care | Payer: Self-pay | Attending: Emergency Medicine | Admitting: Emergency Medicine

## 2020-04-14 DIAGNOSIS — F17200 Nicotine dependence, unspecified, uncomplicated: Secondary | ICD-10-CM | POA: Insufficient documentation

## 2020-04-14 DIAGNOSIS — R110 Nausea: Secondary | ICD-10-CM | POA: Insufficient documentation

## 2020-04-14 DIAGNOSIS — R1032 Left lower quadrant pain: Secondary | ICD-10-CM | POA: Insufficient documentation

## 2020-04-14 LAB — COMPREHENSIVE METABOLIC PANEL
ALT: 27 U/L (ref 0–44)
AST: 23 U/L (ref 15–41)
Albumin: 4.2 g/dL (ref 3.5–5.0)
Alkaline Phosphatase: 51 U/L (ref 38–126)
Anion gap: 8 (ref 5–15)
BUN: 14 mg/dL (ref 6–20)
CO2: 27 mmol/L (ref 22–32)
Calcium: 9.6 mg/dL (ref 8.9–10.3)
Chloride: 107 mmol/L (ref 98–111)
Creatinine, Ser: 0.9 mg/dL (ref 0.61–1.24)
GFR, Estimated: >60 mL/min (ref >60)
Glucose, Bld: 86 mg/dL (ref 70–99)
Potassium: 3.8 mmol/L (ref 3.5–5.1)
Sodium: 142 mmol/L (ref 135–145)
Total Bilirubin: 1 mg/dL (ref 0.3–1.2)
Total Protein: 6.7 g/dL (ref 6.5–8.1)

## 2020-04-14 LAB — CBC
HCT: 41.2 % (ref 39.0–52.0)
Hemoglobin: 13.8 g/dL (ref 13.0–17.0)
MCH: 31.2 pg (ref 26.0–34.0)
MCHC: 33.5 g/dL (ref 30.0–36.0)
MCV: 93.2 fL (ref 80.0–100.0)
Platelets: 321 10*3/uL (ref 150–400)
RBC: 4.42 MIL/uL (ref 4.22–5.81)
RDW: 11.9 % (ref 11.5–15.5)
WBC: 3.7 10*3/uL — ABNORMAL LOW (ref 4.0–10.5)
nRBC: 0 % (ref 0.0–0.2)

## 2020-04-14 LAB — URINALYSIS, ROUTINE W REFLEX MICROSCOPIC
Bilirubin Urine: NEGATIVE
Glucose, UA: NEGATIVE mg/dL
Hgb urine dipstick: NEGATIVE
Ketones, ur: 5 mg/dL — AB
Leukocytes,Ua: NEGATIVE
Nitrite: NEGATIVE
Protein, ur: NEGATIVE mg/dL
Specific Gravity, Urine: 1.021 (ref 1.005–1.030)
pH: 6 (ref 5.0–8.0)

## 2020-04-14 LAB — LIPASE, BLOOD: Lipase: 42 U/L (ref 11–51)

## 2020-04-14 MED ORDER — SODIUM CHLORIDE 0.9 % IV BOLUS
1000.0000 mL | Freq: Once | INTRAVENOUS | Status: AC
  Administered 2020-04-14: 1000 mL via INTRAVENOUS

## 2020-04-14 MED ORDER — TRAMADOL HCL 50 MG PO TABS
50.0000 mg | ORAL_TABLET | Freq: Once | ORAL | Status: AC
  Administered 2020-04-14: 50 mg via ORAL
  Filled 2020-04-14: qty 1

## 2020-04-14 MED ORDER — ONDANSETRON HCL 4 MG/2ML IJ SOLN
4.0000 mg | Freq: Once | INTRAMUSCULAR | Status: AC
  Administered 2020-04-14: 4 mg via INTRAVENOUS
  Filled 2020-04-14: qty 2

## 2020-04-14 MED ORDER — IOHEXOL 300 MG/ML  SOLN
100.0000 mL | Freq: Once | INTRAMUSCULAR | Status: AC | PRN
  Administered 2020-04-14: 100 mL via INTRAVENOUS

## 2020-04-14 NOTE — ED Notes (Signed)
Walked patient to the bathroom patient did well 

## 2020-04-14 NOTE — ED Provider Notes (Signed)
[facility] EMERGENCY DEPARTMENT Provider Note   CSN: [id number] Arrival date & time: 04/14/20  4098     History Chief Complaint  Patient presents with  . Abdominal Pain    Andres Weaver is a 34 y.o. male.  The history is provided by the patient.  Abdominal Pain Pain location:  LLQ Pain quality: aching   Pain radiates to:  Does not radiate Pain severity:  Moderate Onset quality:  Gradual Timing:  Constant Progression:  Unchanged Chronicity:  New Context: previous surgery   Relieved by:  Nothing Worsened by:  Nothing Associated symptoms: nausea   Associated symptoms: no chest pain, no chills, no cough, no diarrhea, no dysuria, no fever, no hematuria, no shortness of breath, no sore throat and no vomiting   Risk factors: multiple surgeries        History reviewed. No pertinent past medical history.  There are no problems to display for this patient.   Past Surgical History:  Procedure Laterality Date  . LAPAROSCOPIC APPENDECTOMY N/A 05/13/2019   Procedure: APPENDECTOMY LAPAROSCOPIC;  Surgeon: Berna Bue, MD;  Location: [facility] OR;  Service: General;  Laterality: N/A;       History reviewed. No pertinent family history.  Social History   Tobacco Use  . Smoking status: Current Every Day Smoker    Packs/day: 1.00  . Smokeless tobacco: Never Used  Substance Use Topics  . Alcohol use: No  . Drug use: Yes    Types: Marijuana    Home Medications Prior to Admission medications   Medication Sig Start Date End Date Taking? Authorizing Provider  acetaminophen (TYLENOL) 500 MG tablet Take 1 tablet (500 mg total) by mouth every 6 (six) hours as needed for mild pain or fever. 05/13/19   Juliet Rude, PA-C  doxycycline (VIBRAMYCIN) 100 MG capsule Take 1 capsule (100 mg total) by mouth 2 (two) times daily. 07/18/19   Mardella Layman, MD  ibuprofen (MOTRIN IB) 200 MG tablet Take 3 tablets (600 mg total) by mouth every 6 (six) hours as needed  for moderate pain. 05/13/19   Juliet Rude, PA-C  traMADol (ULTRAM) 50 MG tablet Take 1 tablet (50 mg total) by mouth every 6 (six) hours as needed for severe pain. 05/13/19   Juliet Rude, PA-C    Allergies    Iodine  Review of Systems   Review of Systems  Constitutional: Negative for chills and fever.  HENT: Negative for ear pain and sore throat.   Eyes: Negative for pain and visual disturbance.  Respiratory: Negative for cough and shortness of breath.   Cardiovascular: Negative for chest pain and palpitations.  Gastrointestinal: Positive for abdominal pain and nausea. Negative for diarrhea and vomiting.  Genitourinary: Negative for dysuria and hematuria.  Musculoskeletal: Negative for arthralgias and back pain.  Skin: Negative for color change and rash.  Neurological: Negative for seizures and syncope.  All other systems reviewed and are negative.   Physical Exam Updated Vital Signs  ED Triage Vitals [04/14/20 0748]  Enc Vitals Group     BP 129/75     Pulse Rate 95     Resp 18     Temp 98 F (36.7 C)     Temp Source Oral     SpO2 100 %     Weight 140 lb (63.5 kg)     Height 5' 10.75" (1.797 m)     Head Circumference      Peak Flow  Pain Score 7     Pain Loc      Pain Edu?      Excl. in GC?     Physical Exam Vitals and nursing note reviewed.  Constitutional:      General: He is not in acute distress.    Appearance: He is well-developed. He is not ill-appearing.  HENT:     Head: Normocephalic and atraumatic.     Mouth/Throat:     Mouth: Mucous membranes are moist.  Eyes:     Extraocular Movements: Extraocular movements intact.     Conjunctiva/sclera: Conjunctivae normal.  Cardiovascular:     Rate and Rhythm: Normal rate and regular rhythm.     Heart sounds: Normal heart sounds. No murmur heard.   Pulmonary:     Effort: Pulmonary effort is normal. No respiratory distress.     Breath sounds: Normal breath sounds.  Abdominal:     Palpations:  Abdomen is soft.     Tenderness: There is abdominal tenderness in the left lower quadrant. There is guarding. There is no right CVA tenderness or left CVA tenderness. Negative signs include Murphy's sign.  Genitourinary:    Penis: Normal.      Testes: Normal. Cremasteric reflex is present.        Right: Mass, tenderness or swelling not present.        Left: Mass, tenderness or swelling not present.  Musculoskeletal:     Cervical back: Neck supple.  Skin:    General: Skin is warm and dry.     Capillary Refill: Capillary refill takes less than 2 seconds.  Neurological:     Mental Status: He is alert.     ED Results / Procedures / Treatments   Labs (all labs ordered are listed, but only abnormal results are displayed) Labs Reviewed  CBC - Abnormal; Notable for the following components:      Result Value   WBC 3.7 (*)    All other components within normal limits  URINALYSIS, ROUTINE W REFLEX MICROSCOPIC - Abnormal; Notable for the following components:   APPearance HAZY (*)    Ketones, ur 5 (*)    All other components within normal limits  LIPASE, BLOOD  COMPREHENSIVE METABOLIC PANEL    EKG None  Radiology CT ABDOMEN PELVIS W CONTRAST  Result Date: 04/14/2020 CLINICAL DATA:  Diverticulitis suspected, question small bowel obstruction. History of appendectomy. EXAM: CT ABDOMEN AND PELVIS WITH CONTRAST TECHNIQUE: Multidetector CT imaging of the abdomen and pelvis was performed using the standard protocol following bolus administration of intravenous contrast. CONTRAST:  OMNIPAQUE IOHEXOL 300 MG/ML  SOLN COMPARISON:  05/13/2019 FINDINGS: Lower chest:  No contributory findings. Hepatobiliary: No focal liver abnormality.No evidence of biliary obstruction or stone. Pancreas: Unremarkable. Spleen: Unremarkable. Adrenals/Urinary Tract: Negative adrenals. No hydronephrosis or stone. Unremarkable bladder. Stomach/Bowel: No obstruction. No visible bowel inflammation. Appendectomy.  Vascular/Lymphatic: No acute vascular abnormality. No mass or adenopathy. Reproductive:No pathologic findings. Other: No ascites or pneumoperitoneum. Musculoskeletal: No acute abnormalities. IMPRESSION: No acute finding.  No bowel obstruction or visible inflammation. Electronically Signed   By: Marnee Spring M.D.   On: 04/14/2020 10:14    Procedures Procedures (including critical care time)  Medications Ordered in ED Medications  sodium chloride 0.9 % bolus 1,000 mL (1,000 mLs Intravenous New Bag/Given 04/14/20 0925)  ondansetron (ZOFRAN) injection 4 mg (4 mg Intravenous Given 04/14/20 0923)  traMADol (ULTRAM) tablet 50 mg (50 mg Oral Given 04/14/20 1012)  iohexol (OMNIPAQUE) 300 MG/ML solution 100 mL (  100 mLs Intravenous Contrast Given 04/14/20 1007)    ED Course  I have reviewed the triage vital signs and the nursing notes.  Pertinent labs & imaging results that were available during my care of the patient were reviewed by me and considered in my medical decision making (see chart for details).    MDM Rules/Calculators/A&P                          Andres Weaver is a 33 year old male who presents to the ED with lower abdominal pain.  Normal vitals.  No fever.  Pain mostly in the left lower quadrant.  Possibly some constipation.  Has felt nauseated.  Has had his appendix removed in the past.  Denies any trauma or vigorous workouts.  He is fairly tender in the left lower quadrant.  GU exam is unremarkable.  Doubt testicular torsion or other testicular origin of pain.  Could be scar tissue pain versus diverticulitis versus constipation versus MSK vs kidney stone.  Will get lab work including CT scan abdomen pelvis and re-evaluate.  Lab work showed no significant anemia, electrolyte abnormality, kidney injury. CT scan of the abdomen pelvis showed no bowel obstruction, no diverticulitis. Overall unremarkable work-up. Suspect MSK type pain. Discharged in good condition.  This chart was  dictated using voice recognition software.  Despite best efforts to proofread,  errors can occur which can change the documentation meaning.    Final Clinical Impression(s) / ED Diagnoses Final diagnoses:  Left lower quadrant abdominal pain    Rx / DC Orders ED Discharge Orders    None       Virgina Norfolk, DO 04/14/20 1037

## 2020-04-14 NOTE — ED Triage Notes (Signed)
Pt states he started having left lower abd pain stating it feels like when he had his appendix removed. Denies vomiting, but is nauseated. Denies diarrhea.

## 2020-04-14 NOTE — ED Notes (Signed)
Took patient saline lock out patient is getting dressed °

## 2020-04-18 ENCOUNTER — Telehealth: Payer: Self-pay | Admitting: General Practice

## 2020-05-16 ENCOUNTER — Ambulatory Visit: Payer: Self-pay | Attending: Nurse Practitioner | Admitting: Nurse Practitioner

## 2020-05-16 ENCOUNTER — Encounter: Payer: Self-pay | Admitting: Nurse Practitioner

## 2020-05-16 ENCOUNTER — Other Ambulatory Visit: Payer: Self-pay

## 2020-05-16 VITALS — Ht 70.0 in | Wt 140.0 lb

## 2020-05-16 DIAGNOSIS — R1032 Left lower quadrant pain: Secondary | ICD-10-CM

## 2020-05-16 DIAGNOSIS — Z7689 Persons encountering health services in other specified circumstances: Secondary | ICD-10-CM

## 2020-05-16 MED ORDER — DICYCLOMINE HCL 10 MG PO CAPS: 10.0000 mg | ORAL_CAPSULE | Freq: Three times a day (TID) | ORAL | 0 refills | Status: AC

## 2020-05-16 MED ORDER — OMEPRAZOLE 20 MG PO CPDR: 20.0000 mg | DELAYED_RELEASE_CAPSULE | Freq: Two times a day (BID) | ORAL | 0 refills | Status: AC

## 2020-05-16 MED FILL — OMEPRAZOLE 20 MG CAP: 20 | 30 days supply | Qty: 60 | Fill #0

## 2020-05-16 MED FILL — DICYCLOMINE 10 MG CAPSULE: 10 | 30 days supply | Qty: 90 | Fill #0

## 2020-05-16 NOTE — Progress Notes (Signed)
Virtual Visit via Telephone Note Due to national recommendations of social distancing due to COVID 19, telehealth visit is felt to be most appropriate for this patient at this time.  I discussed the limitations, risks, security and privacy concerns of performing an evaluation and management service by telephone and the availability of in person appointments. I also discussed with the patient that there may be a patient responsible charge related to this service. The patient expressed understanding and agreed to proceed.    I connected with Andres Weaver on 05/16/20  at   9:30 AM EST  EDT by telephone and verified that I am speaking with the correct person using two identifiers.   Consent I discussed the limitations, risks, security and privacy concerns of performing an evaluation and management service by telephone and the availability of in person appointments. I also discussed with the patient that there may be a patient responsible charge related to this service. The patient expressed understanding and agreed to proceed.   Location of Patient: Private Residence    Location of Provider: [facility]    Persons participating in Telemedicine visit: Bertram Denver FNP-BC YY [place] CMA Quinterius Farris Has    History of Present Illness: Telemedicine visit for: Establish Care  PMH significant only for lap appy.   He was evaluated in the ED on 04-14-2020 with complaints of LLQ abdominal pain and nausea. GU exam was unremakable as well as CT of abdomen which was negative for bowel obstruction or diverticulitis. Discharge diagnosis was MSK pain.   Today he endorses persistent LLQ pain and feels this could be related to his past appendectomy. Denies constipation and states pain is worse after eating meals. "Feels like a searing pick in my stomach".  Denies vomiting or hematemesis.  History reviewed. No pertinent past medical history.  Past Surgical History:   Procedure Laterality Date   LAPAROSCOPIC APPENDECTOMY N/A 05/13/2019   Procedure: APPENDECTOMY LAPAROSCOPIC;  Surgeon: Berna Bue, MD;  Location: [facility] OR;  Service: General;  Laterality: N/A;    Family History  Problem Relation Age of Onset   Diabetes Neg Hx     Social History   Socioeconomic History   Marital status: Married    Spouse name: Not on file   Number of children: Not on file   Years of education: Not on file   Highest education level: Not on file  Occupational History   Not on file  Tobacco Use   Smoking status: Current Every Day Smoker    Packs/day: 1.00   Smokeless tobacco: Never Used  Substance and Sexual Activity   Alcohol use: No   Drug use: Yes    Types: Marijuana   Sexual activity: Not on file  Other Topics Concern   Not on file  Social History Narrative   Not on file   Social Determinants of Health   Financial Resource Strain:    Difficulty of Paying Living Expenses: Not on file  Food Insecurity:    Worried About Running Out of Food in the Last Year: Not on file   Ran Out of Food in the Last Year: Not on file  Transportation Needs:    Lack of Transportation (Medical): Not on file   Lack of Transportation (Non-Medical): Not on file  Physical Activity:    Days of Exercise per Week: Not on file   Minutes of Exercise per Session: Not on file  Stress:    Feeling of Stress : Not on  file  Social Connections:    Frequency of Communication with Friends and Family: Not on file   Frequency of Social Gatherings with Friends and Family: Not on file   Attends Religious Services: Not on file   Active Member of Clubs or Organizations: Not on file   Attends Banker Meetings: Not on file   Marital Status: Not on file     Observations/Objective: Awake, alert and oriented x 3   Review of Systems  Constitutional: Negative for fever, malaise/fatigue and weight loss.  HENT: Negative.  Negative for nosebleeds.    Eyes: Negative.  Negative for blurred vision, double vision and photophobia.  Respiratory: Negative.  Negative for cough and shortness of breath.   Cardiovascular: Negative.  Negative for chest pain, palpitations and leg swelling.  Gastrointestinal: Positive for abdominal pain. Negative for blood in stool, constipation, diarrhea, heartburn, melena, nausea and vomiting.  Musculoskeletal: Negative.  Negative for myalgias.  Neurological: Negative.  Negative for dizziness, focal weakness, seizures and headaches.  Psychiatric/Behavioral: Negative.  Negative for suicidal ideas.    Assessment and Plan: Beverley was seen today for hospitalization follow-up.  Diagnoses and all orders for this visit:  Encounter to establish care  Left lower quadrant abdominal pain -     omeprazole (PRILOSEC) 20 MG capsule; Take 1 capsule (20 mg total) by mouth 2 (two) times daily before a meal. -     dicyclomine (BENTYL) 10 MG capsule; Take 1 capsule (10 mg total) by mouth 3 (three) times daily before meals. Being treated for GERD at this time.    Follow Up Instructions Return in about 4 weeks (around 06/13/2020) for abdominal pain.     I discussed the assessment and treatment plan with the patient. The patient was provided an opportunity to ask questions and all were answered. The patient agreed with the plan and demonstrated an understanding of the instructions.   The patient was advised to call back or seek an in-person evaluation if the symptoms worsen or if the condition fails to improve as anticipated.  I provided 18 minutes of non-face-to-face time during this encounter including median intraservice time, reviewing previous notes, labs, imaging, medications and explaining diagnosis and management.  Claiborne Rigg, FNP-BC

## 2020-05-18 ENCOUNTER — Encounter: Payer: Self-pay | Admitting: Nurse Practitioner

## 2020-06-08 ENCOUNTER — Ambulatory Visit: Payer: Self-pay | Admitting: Nurse Practitioner

## 2020-07-04 ENCOUNTER — Ambulatory Visit: Payer: Self-pay | Admitting: Nurse Practitioner

## 2020-07-17 ENCOUNTER — Encounter (HOSPITAL_COMMUNITY): Payer: Self-pay

## 2020-07-17 ENCOUNTER — Ambulatory Visit (HOSPITAL_COMMUNITY)
Admission: EM | Admit: 2020-07-17 | Discharge: 2020-07-17 | Disposition: A | Discharge status: Home / Self Care | Payer: Self-pay | Attending: Emergency Medicine | Admitting: Emergency Medicine

## 2020-07-17 ENCOUNTER — Other Ambulatory Visit: Payer: Self-pay

## 2020-07-17 DIAGNOSIS — M7918 Myalgia, other site: Secondary | ICD-10-CM

## 2020-07-17 MED ORDER — IBUPROFEN 600 MG PO TABS: 600.0000 mg | ORAL_TABLET | Freq: Four times a day (QID) | ORAL | 0 refills | Status: DC | PRN

## 2020-07-17 MED ORDER — METHOCARBAMOL 500 MG PO TABS: 500.0000 mg | ORAL_TABLET | Freq: Two times a day (BID) | ORAL | 0 refills | Status: DC
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
## 2020-07-17 NOTE — Discharge Instructions (Signed)
Take ibuprofen as needed for discomfort.  Take the muscle relaxer as needed for muscle spasm; Do not drive, operate machinery, or drink alcohol with this medication as it can cause drowsiness.   Follow up with your primary care provider or an orthopedist if your symptoms are not improving.     
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
## 2020-07-23 ENCOUNTER — Emergency Department (HOSPITAL_COMMUNITY): Payer: Self-pay

## 2020-07-23 ENCOUNTER — Other Ambulatory Visit: Payer: Self-pay

## 2020-07-23 ENCOUNTER — Encounter (HOSPITAL_COMMUNITY): Payer: Self-pay

## 2020-07-23 ENCOUNTER — Emergency Department (HOSPITAL_COMMUNITY)
Admission: EM | Admit: 2020-07-23 | Discharge: 2020-07-24 | Disposition: A | Discharge status: Home / Self Care | Payer: Self-pay | Attending: Emergency Medicine | Admitting: Emergency Medicine

## 2020-07-23 DIAGNOSIS — F17200 Nicotine dependence, unspecified, uncomplicated: Secondary | ICD-10-CM | POA: Diagnosis not present

## 2020-07-23 DIAGNOSIS — Y92410 Unspecified street and highway as the place of occurrence of the external cause: Secondary | ICD-10-CM | POA: Diagnosis not present

## 2020-07-23 DIAGNOSIS — M542 Cervicalgia: Secondary | ICD-10-CM | POA: Insufficient documentation

## 2020-07-23 DIAGNOSIS — M5442 Lumbago with sciatica, left side: Secondary | ICD-10-CM | POA: Diagnosis not present

## 2020-07-23 DIAGNOSIS — M545 Low back pain, unspecified: Secondary | ICD-10-CM | POA: Diagnosis present

## 2020-07-23 LAB — CBC
HCT: 43 % (ref 39.0–52.0)
Hemoglobin: 14.3 g/dL (ref 13.0–17.0)
MCH: 30.4 pg (ref 26.0–34.0)
MCHC: 33.3 g/dL (ref 30.0–36.0)
MCV: 91.5 fL (ref 80.0–100.0)
Platelets: 393 10*3/uL (ref 150–400)
RBC: 4.7 MIL/uL (ref 4.22–5.81)
RDW: 12.2 % (ref 11.5–15.5)
WBC: 4.6 10*3/uL (ref 4.0–10.5)
nRBC: 0 % (ref 0.0–0.2)

## 2020-07-23 MED ORDER — DIAZEPAM 5 MG PO TABS
5.0000 mg | ORAL_TABLET | Freq: Once | ORAL | Status: AC
  Administered 2020-07-23: 5 mg via ORAL
  Filled 2020-07-23: qty 1

## 2020-07-23 NOTE — ED Notes (Signed)
Patient transported to MRI 
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
## 2020-07-24 ENCOUNTER — Emergency Department (HOSPITAL_COMMUNITY): Payer: Self-pay

## 2020-07-24 LAB — BASIC METABOLIC PANEL
Anion gap: 10 (ref 5–15)
BUN: 13 mg/dL (ref 6–20)
CO2: 25 mmol/L (ref 22–32)
Calcium: 9.2 mg/dL (ref 8.9–10.3)
Chloride: 106 mmol/L (ref 98–111)
Creatinine, Ser: 0.85 mg/dL (ref 0.61–1.24)
GFR, Estimated: >60 mL/min (ref >60)
Glucose, Bld: 91 mg/dL (ref 70–99)
Potassium: 3.6 mmol/L (ref 3.5–5.1)
Sodium: 141 mmol/L (ref 135–145)

## 2020-07-24 MED ORDER — KETOROLAC TROMETHAMINE 30 MG/ML IJ SOLN
30.0000 mg | Freq: Once | INTRAMUSCULAR | Status: AC
  Administered 2020-07-24: 30 mg via INTRAMUSCULAR
  Filled 2020-07-24: qty 1

## 2020-07-24 MED ORDER — CYCLOBENZAPRINE HCL 10 MG PO TABS: 10.0000 mg | ORAL_TABLET | Freq: Three times a day (TID) | ORAL | 0 refills | Status: DC | PRN

## 2020-07-24 MED ORDER — IBUPROFEN 600 MG PO TABS: 600.0000 mg | ORAL_TABLET | Freq: Four times a day (QID) | ORAL | 0 refills | Status: AC | PRN

## 2020-07-24 NOTE — ED Provider Notes (Signed)
No acute traumatic findings on MRI.  Patient already knew about the disc issues.  He will f/u with his doctor.  RX for flexeril and ibuprofen.  Stable for discharge.   Roxy Horseman, PA-C 07/24/20 Lupita Shutter    Tilden Fossa, MD 07/24/20 [phone]

## 2020-07-24 NOTE — Discharge Instructions (Signed)
Please take Ibuprofen (Advil, motrin) and Tylenol (acetaminophen) to relieve your pain.   ° °You may take up to 600 MG (3 pills) of normal strength ibuprofen every 8 hours as needed.   °You make take tylenol, up to 1,000 mg (two extra strength pills) every 8 hours as needed.  ° °It is safe to take ibuprofen and tylenol at the same time as they work differently.  ° Do not take more than 3,000 mg tylenol in a 24 hour period (not more than one dose every 8 hours.  Please check all medication labels as many medications such as pain and cold medications may contain tylenol.  Do not drink alcohol while taking these medications.  Do not take other NSAID'S while taking ibuprofen (such as aleve or naproxen).  Please take ibuprofen with food to decrease stomach upset. ° °Today you received medications that may make you sleepy or impair your ability to make decisions.  For the next 24 hours please do not drive, operate heavy machinery, care for a small child with out another adult present, or perform any activities that may cause harm to you or someone else if you were to fall asleep or be impaired.  ° °
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
## 2021-08-10 ENCOUNTER — Other Ambulatory Visit: Payer: Self-pay

## 2021-08-10 ENCOUNTER — Other Ambulatory Visit (HOSPITAL_BASED_OUTPATIENT_CLINIC_OR_DEPARTMENT_OTHER): Payer: Self-pay

## 2021-08-10 ENCOUNTER — Encounter (HOSPITAL_BASED_OUTPATIENT_CLINIC_OR_DEPARTMENT_OTHER): Payer: Self-pay

## 2021-08-10 ENCOUNTER — Emergency Department (HOSPITAL_BASED_OUTPATIENT_CLINIC_OR_DEPARTMENT_OTHER)
Admission: EM | Admit: 2021-08-10 | Discharge: 2021-08-10 | Disposition: A | Discharge status: Home / Self Care | Payer: Self-pay | Attending: Emergency Medicine | Admitting: Emergency Medicine

## 2021-08-10 DIAGNOSIS — J069 Acute upper respiratory infection, unspecified: Secondary | ICD-10-CM

## 2021-08-10 DIAGNOSIS — Z20822 Contact with and (suspected) exposure to covid-19: Secondary | ICD-10-CM | POA: Insufficient documentation

## 2021-08-10 DIAGNOSIS — B349 Viral infection, unspecified: Secondary | ICD-10-CM | POA: Insufficient documentation

## 2021-08-10 DIAGNOSIS — J399 Disease of upper respiratory tract, unspecified: Secondary | ICD-10-CM | POA: Insufficient documentation

## 2021-08-10 LAB — RESP PANEL BY RT-PCR (FLU A&B, COVID) ARPGX2
Influenza A by PCR: NEGATIVE
Influenza B by PCR: NEGATIVE
SARS Coronavirus 2 by RT PCR: NEGATIVE

## 2021-08-10 MED ORDER — FLUTICASONE PROPIONATE 50 MCG/ACT NA SUSP
2.0000 | Freq: Every day | NASAL | 0 refills | Status: AC
  Filled 2021-08-10: qty 16, 30d supply, fill #0
  Filled 2021-08-30: qty 16, 28d supply, fill #0

## 2021-08-10 NOTE — ED Triage Notes (Signed)
Pt presents with a fever, headache which as resolved, feels dehydrated, upper body aches, chills and fatigue x3 days

## 2021-08-10 NOTE — ED Provider Notes (Signed)
[facility] EMERGENCY DEPT Provider Note   CSN: [id number] Arrival date & time: 08/10/21  1130     History  Chief Complaint  Patient presents with   Fever    Daily Andres Weaver is a 36 y.o. male.  36 year old male presents today for evaluation of fever, chills, body aches, postnasal drainage, sinus congestion of 3-day duration.  He has not taken anything prior to arrival.  He reports he has been adequately hydrating and increasing his intake of vegetables.  He reports he has not had a fever today.  Denies difficulty with p.o. intakes, abdominal pain, nausea, vomiting.  Unsure of known sick contacts.  He does endorse cough which is worse in the morning and at night when he lies down.  Endorses postnasal drainage.  The history is provided by the patient. No language interpreter was used.      Home Medications Prior to Admission medications   Medication Sig Start Date End Date Taking? Authorizing Provider  acetaminophen (TYLENOL) 500 MG tablet Take 1 tablet (500 mg total) by mouth every 6 (six) hours as needed for mild pain or fever. 05/13/19   Juliet Rude, PA-C  cyclobenzaprine (FLEXERIL) 10 MG tablet Take 1 tablet (10 mg total) by mouth 3 (three) times daily as needed for muscle spasms. 07/24/20   Roxy Horseman, PA-C  dicyclomine (BENTYL) 10 MG capsule Take 1 capsule (10 mg total) by mouth 3 (three) times daily before meals. 05/16/20 06/15/20  Claiborne Rigg, NP  ibuprofen (ADVIL) 600 MG tablet Take 1 tablet (600 mg total) by mouth every 6 (six) hours as needed. 07/24/20   Roxy Horseman, PA-C  omeprazole (PRILOSEC) 20 MG capsule Take 1 capsule (20 mg total) by mouth 2 (two) times daily before a meal. 05/16/20 08/14/20  Claiborne Rigg, NP      Allergies    Iodine    Review of Systems   Review of Systems  Constitutional:  Negative for chills and fever.  HENT:  Positive for congestion and postnasal drip. Negative for sore throat, trouble swallowing and voice  change.   Respiratory:  Positive for cough. Negative for shortness of breath.   Gastrointestinal:  Negative for nausea and vomiting.  All other systems reviewed and are negative.  Physical Exam Updated Vital Signs BP 124/83 (BP Location: Right Arm)    Pulse 88    Temp 99 F (37.2 C) (Oral)    Resp 16    SpO2 100%  Physical Exam Vitals and nursing note reviewed.  Constitutional:      General: He is not in acute distress.    Appearance: Normal appearance. He is not ill-appearing.  HENT:     Head: Normocephalic and atraumatic.     Nose: Nose normal.  Eyes:     General: No scleral icterus.    Extraocular Movements: Extraocular movements intact.     Conjunctiva/sclera: Conjunctivae normal.  Cardiovascular:     Rate and Rhythm: Normal rate and regular rhythm.     Pulses: Normal pulses.     Heart sounds: Normal heart sounds.  Pulmonary:     Effort: Pulmonary effort is normal. No respiratory distress.     Breath sounds: Normal breath sounds. No wheezing or rales.  Abdominal:     General: There is no distension.     Palpations: Abdomen is soft.     Tenderness: There is no abdominal tenderness.  Musculoskeletal:        General: Normal range of motion.  Cervical back: Normal range of motion.  Skin:    General: Skin is warm and dry.  Neurological:     General: No focal deficit present.     Mental Status: He is alert. Mental status is at baseline.    ED Results / Procedures / Treatments   Labs (all labs ordered are listed, but only abnormal results are displayed) Labs Reviewed  RESP PANEL BY RT-PCR (FLU A&B, COVID) ARPGX2    EKG None  Radiology No results found.  Procedures Procedures    Medications Ordered in ED Medications - No data to display  ED Course/ Medical Decision Making/ A&P                           Medical Decision Making  Medical Decision Making / ED Course   This patient presents to the ED for concern of URI symptoms of 3-day duration, this  involves an extensive number of treatment options, and is a complaint that carries with it a high risk of complications and morbidity.  The differential diagnosis includes COVID, flu, viral upper respiratory infection, pneumonia  MDM: 36 year old male presents today for evaluation of URI symptoms of 3-day duration.  Notes improvement of fever today.  He has been able to adequately hydrate.  Has been increasing his vegetable intake.  Has not taken any over-the-counter medications including Tylenol or ibuprofen.  Denies shortness of breath, chest pain.  Endorses cough but primarily present in the morning and in the evenings while he is lying down.  Likely secondary to postnasal drainage.  Lung sounds are clear on exam.  Doubt pneumonia.  Without hypoxia, or complaint of shortness of breath.  Will defer chest x-ray. Respiratory panel negative for COVID or flu.  Symptomatic treatment discussed.  Patient is appropriate for discharge.  Discharged in stable condition.  Patient voices understanding and is in agreement with plan.  Lab Tests: -I ordered, reviewed, and interpreted labs.   The pertinent results include:   Labs Reviewed  RESP PANEL BY RT-PCR (FLU A&B, COVID) ARPGX2      EKG  EKG Interpretation  Date/Time:    Ventricular Rate:    PR Interval:    QRS Duration:   QT Interval:    QTC Calculation:   R Axis:     Text Interpretation:           Medicines ordered and prescription drug management: No orders of the defined types were placed in this encounter.   -I have reviewed the patients home medicines and have made adjustments as needed  Co morbidities that complicate the patient evaluation History reviewed. No pertinent past medical history.    Dispostion: Patient is appropriate for discharge.  Discharged in stable condition.  Final Clinical Impression(s) / ED Diagnoses Final diagnoses:  Viral upper respiratory illness    Rx / DC Orders ED Discharge Orders           Ordered    fluticasone (FLONASE) 50 MCG/ACT nasal spray  Daily        08/10/21 1344              Marita Kansas, PA-C 08/10/21 1344    Lorre Nick, MD 08/14/21 1016

## 2021-08-10 NOTE — Discharge Instructions (Addendum)
Your respiratory panel was negative for COVID or flu.  You likely have a viral upper respiratory infection.  As discussed I have sent in Flonase to the pharmacy for you.  Continue drinking plenty of fluid, take Tylenol or ibuprofen for fever or muscle aches.  This typically has a 5 to 7-day course.  If your symptoms worsen you can return to the emergency room.

## 2021-08-11 ENCOUNTER — Other Ambulatory Visit (HOSPITAL_BASED_OUTPATIENT_CLINIC_OR_DEPARTMENT_OTHER): Payer: Self-pay

## 2021-08-21 ENCOUNTER — Other Ambulatory Visit (HOSPITAL_BASED_OUTPATIENT_CLINIC_OR_DEPARTMENT_OTHER): Payer: Self-pay

## 2021-08-30 ENCOUNTER — Other Ambulatory Visit (HOSPITAL_BASED_OUTPATIENT_CLINIC_OR_DEPARTMENT_OTHER): Payer: Self-pay

## 2021-08-30 ENCOUNTER — Other Ambulatory Visit: Payer: Self-pay

## 2021-08-31 ENCOUNTER — Other Ambulatory Visit (HOSPITAL_BASED_OUTPATIENT_CLINIC_OR_DEPARTMENT_OTHER): Payer: Self-pay

## 2023-02-25 ENCOUNTER — Emergency Department (HOSPITAL_BASED_OUTPATIENT_CLINIC_OR_DEPARTMENT_OTHER)
Admission: EM | Admit: 2023-02-25 | Discharge: 2023-02-25 | Disposition: A | Discharge status: Home / Self Care | Payer: Self-pay | Attending: Emergency Medicine | Admitting: Emergency Medicine

## 2023-02-25 ENCOUNTER — Encounter (HOSPITAL_BASED_OUTPATIENT_CLINIC_OR_DEPARTMENT_OTHER): Payer: Self-pay | Admitting: Emergency Medicine

## 2023-02-25 ENCOUNTER — Other Ambulatory Visit: Payer: Self-pay

## 2023-02-25 ENCOUNTER — Other Ambulatory Visit (HOSPITAL_BASED_OUTPATIENT_CLINIC_OR_DEPARTMENT_OTHER): Payer: Self-pay

## 2023-02-25 ENCOUNTER — Emergency Department (HOSPITAL_BASED_OUTPATIENT_CLINIC_OR_DEPARTMENT_OTHER): Payer: Self-pay

## 2023-02-25 DIAGNOSIS — R109 Unspecified abdominal pain: Secondary | ICD-10-CM | POA: Insufficient documentation

## 2023-02-25 LAB — COMPREHENSIVE METABOLIC PANEL
ALT: 15 U/L (ref 0–44)
AST: 13 U/L — ABNORMAL LOW (ref 15–41)
Albumin: 3.9 g/dL (ref 3.5–5.0)
Alkaline Phosphatase: 56 U/L (ref 38–126)
Anion gap: 5 (ref 5–15)
BUN: 13 mg/dL (ref 6–20)
CO2: 27 mmol/L (ref 22–32)
Calcium: 8.8 mg/dL — ABNORMAL LOW (ref 8.9–10.3)
Chloride: 110 mmol/L (ref 98–111)
Creatinine, Ser: 0.67 mg/dL (ref 0.61–1.24)
GFR, Estimated: >60 mL/min (ref >60)
Glucose, Bld: 88 mg/dL (ref 70–99)
Potassium: 4.2 mmol/L (ref 3.5–5.1)
Sodium: 142 mmol/L (ref 135–145)
Total Bilirubin: 0.2 mg/dL — ABNORMAL LOW (ref 0.3–1.2)
Total Protein: 5.8 g/dL — ABNORMAL LOW (ref 6.5–8.1)

## 2023-02-25 LAB — CBC WITH DIFFERENTIAL/PLATELET
Abs Immature Granulocytes: 0.01 10*3/uL (ref 0.00–0.07)
Basophils Absolute: 0 10*3/uL (ref 0.0–0.1)
Basophils Relative: 1 %
Eosinophils Absolute: 0.1 10*3/uL (ref 0.0–0.5)
Eosinophils Relative: 2 %
HCT: 40.4 % (ref 39.0–52.0)
Hemoglobin: 13.7 g/dL (ref 13.0–17.0)
Immature Granulocytes: 0 %
Lymphocytes Relative: 35 %
Lymphs Abs: 1.4 10*3/uL (ref 0.7–4.0)
MCH: 31 pg (ref 26.0–34.0)
MCHC: 33.9 g/dL (ref 30.0–36.0)
MCV: 91.4 fL (ref 80.0–100.0)
Monocytes Absolute: 0.3 10*3/uL (ref 0.1–1.0)
Monocytes Relative: 8 %
Neutro Abs: 2.1 10*3/uL (ref 1.7–7.7)
Neutrophils Relative %: 54 %
Platelets: 287 10*3/uL (ref 150–400)
RBC: 4.42 MIL/uL (ref 4.22–5.81)
RDW: 12.1 % (ref 11.5–15.5)
WBC: 4 10*3/uL (ref 4.0–10.5)
nRBC: 0 % (ref 0.0–0.2)

## 2023-02-25 LAB — URINALYSIS, ROUTINE W REFLEX MICROSCOPIC
Bilirubin Urine: NEGATIVE
Glucose, UA: NEGATIVE mg/dL
Hgb urine dipstick: NEGATIVE
Ketones, ur: NEGATIVE mg/dL
Leukocytes,Ua: NEGATIVE
Nitrite: NEGATIVE
Protein, ur: NEGATIVE mg/dL
Specific Gravity, Urine: 1.022 (ref 1.005–1.030)
pH: 6 (ref 5.0–8.0)

## 2023-02-25 LAB — LIPASE, BLOOD: Lipase: 69 U/L — ABNORMAL HIGH (ref 11–51)

## 2023-02-25 MED ORDER — ONDANSETRON HCL 4 MG/2ML IJ SOLN
4.0000 mg | Freq: Once | INTRAMUSCULAR | Status: AC
  Administered 2023-02-25: 4 mg via INTRAVENOUS
  Filled 2023-02-25: qty 2

## 2023-02-25 MED ORDER — MORPHINE SULFATE (PF) 4 MG/ML IV SOLN
4.0000 mg | Freq: Once | INTRAVENOUS | Status: AC
  Administered 2023-02-25: 4 mg via INTRAVENOUS
  Filled 2023-02-25: qty 1

## 2023-02-25 MED ORDER — KETOROLAC TROMETHAMINE 15 MG/ML IJ SOLN
15.0000 mg | Freq: Once | INTRAMUSCULAR | Status: AC
  Administered 2023-02-25: 15 mg via INTRAVENOUS
  Filled 2023-02-25: qty 1

## 2023-02-25 MED ORDER — CYCLOBENZAPRINE HCL 10 MG PO TABS
10.0000 mg | ORAL_TABLET | Freq: Three times a day (TID) | ORAL | 0 refills | Status: AC | PRN
  Filled 2023-02-25: qty 15, 5d supply, fill #0

## 2023-02-25 MED ORDER — IOHEXOL 300 MG/ML  SOLN
100.0000 mL | Freq: Once | INTRAMUSCULAR | Status: AC | PRN
  Administered 2023-02-25: 75 mL via INTRAVENOUS

## 2023-02-25 MED ORDER — LIDOCAINE 5 % EX PTCH
1.0000 | MEDICATED_PATCH | CUTANEOUS | Status: DC
  Administered 2023-02-25: 1 via TRANSDERMAL
  Filled 2023-02-25: qty 1

## 2023-02-25 MED ORDER — CYCLOBENZAPRINE HCL 10 MG PO TABS: 10.0000 mg | ORAL_TABLET | Freq: Three times a day (TID) | ORAL | 0 refills | Status: DC | PRN

## 2023-02-25 MED ORDER — ACETAMINOPHEN 500 MG PO TABS
1000.0000 mg | ORAL_TABLET | Freq: Once | ORAL | Status: AC
  Administered 2023-02-25: 1000 mg via ORAL
  Filled 2023-02-25: qty 2

## 2023-02-25 NOTE — ED Notes (Signed)
Patient verbalizes understanding of discharge instructions. Opportunity for questioning and answers were provided. Patient discharged from ED.  °

## 2023-02-25 NOTE — Discharge Instructions (Signed)
You were seen for your flank and abdominal pain in the emergency department.   At home, please use over-the-counter Tylenol, ibuprofen, and lidocaine patches.  You may also use the cyclobenzaprine we have prescribed you as needed for pain.  Do not take the cyclobenzaprine before driving or operating heavy machinery.  Follow-up with your primary doctor in 1 week regarding your visit.  If you do not have a primary care doctor you may follow-up with [facility] which is listed in this packet.  Return immediately to the emergency department if you experience any of the following: Numbness or weakness of your legs, bowel or bladder incontinence, numbness while wiping after pooping or urinating, or any other concerning symptoms.    Thank you for visiting our Emergency Department. It was a pleasure taking care of you today.

## 2023-02-25 NOTE — ED Provider Notes (Signed)
[place] EMERGENCY DEPARTMENT AT [facility] Provider Note   CSN: [id number] Arrival date & time: 02/25/23  0746     History {Add pertinent medical, surgical, social history, OB history to HPI:1} Chief Complaint  Patient presents with   Flank Pain    Andres Weaver is a 37 y.o. male.  37 year old male with a history of appendicitis status post appendectomy presents to the emergency department with right-sided abdominal pain for 1 week.  Patient reports that for the past week he has been having right-sided flank pain.  Describes it as constant and a grabbing sensation.  Currently 10/10 in severity.  Worsened with having a bowel movement.  Feels identical to his appendicitis but he had his appendix out in 2020.  No nausea or vomiting or fevers or dysuria or frequency.  No injuries to the area.  No testicular pain.        Home Medications Prior to Admission medications   Medication Sig Start Date End Date Taking? Authorizing Provider  acetaminophen (TYLENOL) 500 MG tablet Take 1 tablet (500 mg total) by mouth every 6 (six) hours as needed for mild pain or fever. 05/13/19   Juliet Rude, PA-C  cyclobenzaprine (FLEXERIL) 10 MG tablet Take 1 tablet (10 mg total) by mouth 3 (three) times daily as needed for muscle spasms. 07/24/20   Roxy Horseman, PA-C  dicyclomine (BENTYL) 10 MG capsule Take 1 capsule (10 mg total) by mouth 3 (three) times daily before meals. 05/16/20 06/15/20  Claiborne Rigg, NP  fluticasone (FLONASE) 50 MCG/ACT nasal spray Place 2 sprays into both nostrils daily. 08/10/21   Marita Kansas, PA-C  ibuprofen (ADVIL) 600 MG tablet Take 1 tablet (600 mg total) by mouth every 6 (six) hours as needed. 07/24/20   Roxy Horseman, PA-C  omeprazole (PRILOSEC) 20 MG capsule Take 1 capsule (20 mg total) by mouth 2 (two) times daily before a meal. 05/16/20 08/14/20  Claiborne Rigg, NP      Allergies    Iodine and Penicillins    Review of Systems   Review of  Systems  Physical Exam Updated Vital Signs BP 126/78 (BP Location: Left Arm)   Pulse 72   Temp 98.7 F (37.1 C) (Oral)   Resp 16   Ht 5\' 10"  (1.778 m)   Wt 65.8 kg   SpO2 100%   BMI 20.81 kg/m  Physical Exam Vitals and nursing note reviewed.  Constitutional:      General: He is not in acute distress.    Appearance: He is well-developed.  HENT:     Head: Normocephalic and atraumatic.     Right Ear: External ear normal.     Left Ear: External ear normal.     Nose: Nose normal.  Eyes:     Extraocular Movements: Extraocular movements intact.     Conjunctiva/sclera: Conjunctivae normal.     Pupils: Pupils are equal, round, and reactive to light.  Cardiovascular:     Rate and Rhythm: Normal rate and regular rhythm.  Pulmonary:     Effort: Pulmonary effort is normal. No respiratory distress.  Abdominal:     General: There is no distension.     Palpations: Abdomen is soft. There is no mass.     Tenderness: There is abdominal tenderness (Right flank). There is no right CVA tenderness, left CVA tenderness or guarding.  Genitourinary:    Testes: Normal.  Musculoskeletal:     Cervical back: Normal range of motion and neck supple.  Skin:    General: Skin is warm and dry.  Neurological:     Mental Status: He is alert. Mental status is at baseline.  Psychiatric:        Mood and Affect: Mood normal.        Behavior: Behavior normal.     ED Results / Procedures / Treatments   Labs (all labs ordered are listed, but only abnormal results are displayed) Labs Reviewed  URINALYSIS, ROUTINE W REFLEX MICROSCOPIC  COMPREHENSIVE METABOLIC PANEL  LIPASE, BLOOD  CBC WITH DIFFERENTIAL/PLATELET    EKG None  Radiology No results found.  Procedures Procedures  {Document cardiac monitor, telemetry assessment procedure when appropriate:1}  Medications Ordered in ED Medications  ketorolac (TORADOL) 15 MG/ML injection 15 mg (has no administration in time range)    ED Course/  Medical Decision Making/ A&P   {   Click here for ABCD2, HEART and other calculatorsREFRESH Note before signing :1}                              Medical Decision Making Amount and/or Complexity of Data Reviewed Labs: ordered. Radiology: ordered.  Risk OTC drugs. Prescription drug management.   ***  {Document critical care time when appropriate:1} {Document review of labs and clinical decision tools ie heart score, Chads2Vasc2 etc:1}  {Document your independent review of radiology images, and any outside records:1} {Document your discussion with family members, caretakers, and with consultants:1} {Document social determinants of health affecting pt's care:1} {Document your decision making why or why not admission, treatments were needed:1} Final Clinical Impression(s) / ED Diagnoses Final diagnoses:  None    Rx / DC Orders ED Discharge Orders     None

## 2023-02-25 NOTE — ED Triage Notes (Signed)
Pt arrives to ED with c/o right side flank/side pain x1 week.

## 2023-02-25 NOTE — ED Notes (Signed)
Patient transported to CT 

## 2023-07-03 ENCOUNTER — Other Ambulatory Visit (HOSPITAL_BASED_OUTPATIENT_CLINIC_OR_DEPARTMENT_OTHER): Payer: Self-pay

## 2023-07-03 ENCOUNTER — Encounter (HOSPITAL_BASED_OUTPATIENT_CLINIC_OR_DEPARTMENT_OTHER): Payer: Self-pay | Admitting: *Deleted

## 2023-07-03 ENCOUNTER — Emergency Department (HOSPITAL_BASED_OUTPATIENT_CLINIC_OR_DEPARTMENT_OTHER)
Admission: EM | Admit: 2023-07-03 | Discharge: 2023-07-03 | Disposition: A | Discharge status: Home / Self Care | Payer: Self-pay | Attending: Emergency Medicine | Admitting: Emergency Medicine

## 2023-07-03 ENCOUNTER — Other Ambulatory Visit: Payer: Self-pay

## 2023-07-03 ENCOUNTER — Emergency Department (HOSPITAL_BASED_OUTPATIENT_CLINIC_OR_DEPARTMENT_OTHER): Payer: Self-pay | Admitting: Radiology

## 2023-07-03 DIAGNOSIS — Z20822 Contact with and (suspected) exposure to covid-19: Secondary | ICD-10-CM | POA: Insufficient documentation

## 2023-07-03 DIAGNOSIS — J101 Influenza due to other identified influenza virus with other respiratory manifestations: Secondary | ICD-10-CM

## 2023-07-03 DIAGNOSIS — J09X2 Influenza due to identified novel influenza A virus with other respiratory manifestations: Secondary | ICD-10-CM | POA: Insufficient documentation

## 2023-07-03 LAB — RESP PANEL BY RT-PCR (RSV, FLU A&B, COVID)  RVPGX2
Influenza A by PCR: POSITIVE — AB
Influenza B by PCR: NEGATIVE
Resp Syncytial Virus by PCR: NEGATIVE
SARS Coronavirus 2 by RT PCR: NEGATIVE

## 2023-07-03 MED ORDER — BENZONATATE 100 MG PO CAPS
100.0000 mg | ORAL_CAPSULE | Freq: Once | ORAL | Status: AC
  Administered 2023-07-03: 100 mg via ORAL
  Filled 2023-07-03: qty 1

## 2023-07-03 MED ORDER — BENZONATATE 100 MG PO CAPS
100.0000 mg | ORAL_CAPSULE | Freq: Three times a day (TID) | ORAL | 0 refills | Status: AC
Start: 2023-07-03 — End: ?
  Filled 2023-07-03: qty 21, 7d supply, fill #0

## 2023-07-03 NOTE — ED Notes (Signed)
 RN reviewed discharge instructions with pt. Pt verbalized understanding and had no further questions. VSS upon discharge.

## 2023-07-03 NOTE — ED Triage Notes (Signed)
 Cough for 6 days.  Chills, pt states that he wants to make sure he does not have pneumonia

## 2023-07-03 NOTE — ED Provider Notes (Signed)
[place] EMERGENCY DEPARTMENT AT [facility] Provider Note   CSN: [id number] Arrival date & time: 07/03/23  1150     History  Chief Complaint  Patient presents with   Cough    Andres Weaver is a 38 y.o. male with no significant past medical history who presents to the ED due to productive cough x 6 days associated with chills.  Also endorses some shortness of breath.  No chest pain.  Patient admits to yellow phlegm.  Did not receive his flu vaccine this year.  Denies nausea, vomiting, diarrhea.  History obtained from patient and past medical records. No interpreter used during encounter.       Home Medications Prior to Admission medications   Medication Sig Start Date End Date Taking? Authorizing Provider  benzonatate (TESSALON) 100 MG capsule Take 1 capsule (100 mg total) by mouth every 8 (eight) hours. 07/03/23  Yes Marguerite Jarboe, Merla Riches, PA-C  acetaminophen (TYLENOL) 500 MG tablet Take 1 tablet (500 mg total) by mouth every 6 (six) hours as needed for mild pain or fever. 05/13/19   Juliet Rude, PA-C  cyclobenzaprine (FLEXERIL) 10 MG tablet Take 1 tablet (10 mg total) by mouth 3 (three) times daily as needed for muscle spasms. 02/25/23   Rondel Baton, MD  dicyclomine (BENTYL) 10 MG capsule Take 1 capsule (10 mg total) by mouth 3 (three) times daily before meals. 05/16/20 06/15/20  Claiborne Rigg, NP  fluticasone (FLONASE) 50 MCG/ACT nasal spray Place 2 sprays into both nostrils daily. 08/10/21   Marita Kansas, PA-C  ibuprofen (ADVIL) 600 MG tablet Take 1 tablet (600 mg total) by mouth every 6 (six) hours as needed. 07/24/20   Roxy Horseman, PA-C  omeprazole (PRILOSEC) 20 MG capsule Take 1 capsule (20 mg total) by mouth 2 (two) times daily before a meal. 05/16/20 08/14/20  Claiborne Rigg, NP      Allergies    Iodine and Penicillins    Review of Systems   Review of Systems  Constitutional:  Positive for chills. Negative for fever.  Respiratory:  Positive for  cough and shortness of breath.   Cardiovascular:  Negative for chest pain.  Musculoskeletal:  Positive for myalgias.    Physical Exam Updated Vital Signs BP 119/73   Pulse 76   Temp 99.1 F (37.3 C) (Oral)   Resp 17   SpO2 97%  Physical Exam Vitals and nursing note reviewed.  Constitutional:      General: He is not in acute distress.    Appearance: He is not ill-appearing.  HENT:     Head: Normocephalic.  Eyes:     Pupils: Pupils are equal, round, and reactive to light.  Cardiovascular:     Rate and Rhythm: Normal rate and regular rhythm.     Pulses: Normal pulses.     Heart sounds: Normal heart sounds. No murmur heard.    No friction rub. No gallop.  Pulmonary:     Effort: Pulmonary effort is normal.     Breath sounds: Normal breath sounds.     Comments: Respirations equal and unlabored, patient able to speak in full sentences, lungs clear to auscultation bilaterally Abdominal:     General: Abdomen is flat. There is no distension.     Palpations: Abdomen is soft.     Tenderness: There is no abdominal tenderness. There is no guarding or rebound.  Musculoskeletal:        General: Normal range of motion.  Cervical back: Neck supple.  Skin:    General: Skin is warm and dry.  Neurological:     General: No focal deficit present.     Mental Status: He is alert.  Psychiatric:        Mood and Affect: Mood normal.        Behavior: Behavior normal.     ED Results / Procedures / Treatments   Labs (all labs ordered are listed, but only abnormal results are displayed) Labs Reviewed  RESP PANEL BY RT-PCR (RSV, FLU A&B, COVID)  RVPGX2 - Abnormal; Notable for the following components:      Result Value   Influenza A by PCR POSITIVE (*)    All other components within normal limits    EKG None  Radiology DG Chest 2 View Result Date: 07/03/2023 CLINICAL DATA:  Fever.  Productive cough. EXAM: CHEST - 2 VIEW COMPARISON:  04/15/2006. FINDINGS: Bilateral lung fields are  clear. Bilateral costophrenic angles are clear. Normal cardio-mediastinal silhouette. No acute osseous abnormalities. The soft tissues are within normal limits. IMPRESSION: No active cardiopulmonary disease. Electronically Signed   By: Jules Schick M.D.   On: 07/03/2023 14:58    Procedures Procedures    Medications Ordered in ED Medications  benzonatate (TESSALON) capsule 100 mg (100 mg Oral Given 07/03/23 1626)    ED Course/ Medical Decision Making/ A&P                                 Medical Decision Making Amount and/or Complexity of Data Reviewed Radiology: ordered and independent interpretation performed. Decision-making details documented in ED Course.  Risk Prescription drug management.   38 year old male presents to the ED due to productive cough x 6 days.  Also endorses some shortness of breath.  No chest pain.  Upon arrival, vitals all within normal limits.  Patient in no acute distress.  Lungs clear to auscultation bilaterally.  Chest x-ray ordered in triage which I personally reviewed and interpreted which is negative for signs of pneumonia, pneumothorax, or widened mediastinum.  RVP positive for influenza A which is likely causing patient's symptoms.  Patient given Jerilynn Som here in the ED with some improvement in cough.  Will discharge patient with Tessalon Perles.  Discussed over-the-counter symptomatic treatment.  No evidence of respiratory distress.  Patient stable for discharge. Strict ED precautions discussed with patient. Patient states understanding and agrees to plan. Patient discharged home in no acute distress and stable vitals  No PCP on file Lives at home       Final Clinical Impression(s) / ED Diagnoses Final diagnoses:  Influenza A    Rx / DC Orders ED Discharge Orders          Ordered    benzonatate (TESSALON) 100 MG capsule  Every 8 hours        07/03/23 1628              Jesusita Oka 07/03/23 1637    Rondel Baton, MD 07/04/23 1259

## 2023-07-03 NOTE — Discharge Instructions (Addendum)
 It was a pleasure taking care of you today.  As discussed, your chest x-ray did not show evidence of pneumonia.  Your flu test was positive which is likely causing your cough.  I am sending you home with cough medication.  Return to the ER for any worsening symptoms.

## 2024-01-16 ENCOUNTER — Encounter (HOSPITAL_COMMUNITY): Payer: Self-pay | Admitting: Physician Assistant

## 2024-01-16 ENCOUNTER — Ambulatory Visit (HOSPITAL_COMMUNITY): Payer: Self-pay | Admitting: Physician Assistant

## 2024-01-16 VITALS — BP 118/93 | HR 69 | Temp 98.5°F | Ht 70.0 in | Wt 141.0 lb

## 2024-01-16 DIAGNOSIS — Z008 Encounter for other general examination: Secondary | ICD-10-CM

## 2024-01-16 NOTE — Progress Notes (Signed)
 Psychiatric Initial Adult Assessment   Patient Identification: Andres Weaver MRN:  [id number] Date of Evaluation:  01/16/2024 Referral Source: Walk-in Chief Complaint:   Chief Complaint  Patient presents with   Establish Care   Visit Diagnosis:    ICD-10-CM   1. Evaluation by psychiatric service required  Z00.8       History of Present Illness:   Andres Weaver is a 38 year old male with a documented psychiatric history significant for ADHD and bipolar disorder who presents to [facility] for an evaluation.  Patient presents to this encounter after being referred by [facility]. 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  Patient reports that he was given a diagnosis of bipolar disorder back in 2008 by an International aid/development worker when going through [employer] [facility]). 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 Patient reports that he was hospitalized at [facility] sometime between 2010 and 2012.  He reports that his Solicitor and sergeant admitted him for self-harm though patient states that he exhibited no signs of self-harm at the time of his admission.  Patient states that he was at the hospital for 2-1/2 weeks and placed on 2 different medications while there.  Patient does not remember what the medications names were but states that they did not agree with him.  Patient was eventually discharged from the hospital on a medication but does not remember the name of the medication.  Patient believes that the medication was  an antihistamine of some sort and whenever he missed a meal while on the medication, he would become irritable. Patient denies any other history of psychiatric medication use.  Patient denies overt depressive symptoms nor does he endorse anxiety.  Patient denies a past history of manic episodes.  He further denies a past history of sleep deficits, elevated energy, irritability, or engaging in risky behavior.  Patient denies paranoia.  He further denies auditory or visual hallucinations.  As previously mentioned, patient has a past history of hospitalization due to mental health.  Patient denies a past history of suicide attempt.  A PHQ-9 screen was performed with the patient scoring a 0.  A GAD-7 screen was also performed with the patient scoring a 0.  Patient is alert and oriented x 4, calm, cooperative, and fully engaged in conversation during the encounter.  Patient endorses great mood.  Patient denies suicidal or homicidal ideations.  He further denies auditory or visual hallucinations and does not appear to be responding to internal/external stimuli.  Patient denies paranoia or delusional thoughts.  Patient endorses good sleep and receives on average 7 to 8 hours of sleep per night.  Patient endorses good appetite and eats on average 3 meals per day.  Patient endorses alcohol consumption sparingly.  Patient endorses tobacco use and smokes on average 5 to 10 cigarettes/day.  Patient denies illicit drug use.  Associated Signs/Symptoms: Depression Symptoms:  Patient denies (Hypo) Manic Symptoms:  Patient denies Anxiety Symptoms:  Patient denies Psychotic Symptoms:  Patient denies PTSD Symptoms: Negative  Past Psychiatric History:  Patient reports that he was given a diagnosis of bipolar disorder back in 2008 by an International aid/development worker when going through [employer] [facility]). - Patient was given a diagnosis of bipolar affective disorder, remission status unspecified (ICD-10: F31.9:  Bipolar disorder, unspecified) - Patient informed provider that when he was assessed through [facility], the provider looked at him and stated that he had bipolar disorder.  Patient reports that he was recently given a diagnosis of ADHD.  Patient endorses a past history of hospitalization due to mental health.  He reports that he was hospitalized at [facility] sometime around 2010 or 2012. - Patient states that he was admitted to the hospital under his sergeant and Solicitor. - Patient reports that he does not know or understand why he was admitted but states that his sergeant and Solicitor admitted him for self-harm.  He reports that his admission was his word against theirs.  Patient denies a past history of suicide attempts.  Patient denies a past history of homicide attempts.  Previous Psychotropic Medications: Yes , patient reports that he was given medication while hospitalized at [facility] but does not remember what medications he was given.  Patient reports that he was discharged on a medication but does not remember what type of medication was.  Substance Abuse History in the last 12 months:  No.  Consequences of Substance Abuse: Negative  Past Medical History: History reviewed. No pertinent past medical history.  Past Surgical History:  Procedure Laterality Date   LAPAROSCOPIC APPENDECTOMY N/A 05/13/2019   Procedure: APPENDECTOMY LAPAROSCOPIC;  Surgeon: Signe Mitzie LABOR, MD;  Location: [facility] OR;  Service: General;  Laterality: N/A;    Family Psychiatric History:  Patient denies a family history of psychiatric illness.  Family history of suicide attempt: Patient denies Family history of homicide attempt: Patient denies Family history of substance abuse: Patient denies  Family History:  Family History  Problem Relation Age of Onset   Diabetes Neg Hx     Social History:   Social History   Socioeconomic History   Marital status:  Divorced    Spouse name: Not on file   Number of children: Not on file   Years of education: Not on file   Highest education level: Not on file  Occupational History   Not on file  Tobacco Use   Smoking status: Every Day    Current packs/day: 1.00    Types: Cigarettes   Smokeless tobacco: Never  Substance and Sexual Activity   Alcohol use: No   Drug use: Yes    Types: Marijuana   Sexual activity: Not on file  Other Topics Concern   Not on file  Social History Narrative   Not on file   Social Drivers of Health   Financial Resource Strain: Not on file  Food Insecurity: Not on file  Transportation Needs: Not on file  Physical Activity: Not on file  Stress: Not on file  Social Connections: Not on file    Additional Social History:  Patient endorses social support.  Patient endorses having children.  Patient denies housing at this time.  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  Patient endorses a past history of military experience stating that he was in the [employer] from 2008-2020.  Patient denies a past history of prison or jail time.  Highest education earned by the patient is high school.  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  Patient denies access to weapons.  Allergies:   Allergies  Allergen Reactions   Iodine Nausea And Vomiting   Penicillins     Metabolic Disorder Labs: No results found for: HGBA1C, MPG No results found for: PROLACTIN No results found for: CHOL, TRIG, HDL, CHOLHDL, VLDL, LDLCALC No results found for: TSH  Therapeutic Level Labs: No results found for: LITHIUM No results found for: CBMZ No results found for: VALPROATE  Current Medications: Current Outpatient Medications  Medication Sig Dispense Refill   acetaminophen  (TYLENOL ) 500 MG tablet Take 1 tablet (500 mg total) by mouth every 6 (six) hours as needed for mild pain or fever.     benzonatate  (TESSALON ) 100 MG  capsule Take 1 capsule (100 mg total) by mouth every 8 (eight) hours. 21 capsule 0   cyclobenzaprine  (FLEXERIL ) 10 MG tablet Take 1 tablet (10 mg total) by mouth 3 (three) times daily as needed for muscle spasms. 15 tablet 0   dicyclomine  (BENTYL ) 10 MG capsule Take 1 capsule (10 mg total) by mouth 3 (three) times daily before meals. 120 capsule 0   fluticasone  (FLONASE ) 50 MCG/ACT nasal spray Place 2 sprays into both nostrils daily. 16 g 0   ibuprofen  (ADVIL ) 600 MG tablet Take 1 tablet (600 mg total) by mouth every 6 (six) hours as needed. 15 tablet 0   omeprazole  (PRILOSEC) 20 MG capsule Take 1 capsule (20 mg total) by mouth 2 (two) times daily before a meal. 180 capsule 0   No current facility-administered medications for this visit.    Musculoskeletal: Strength & Muscle Tone: within normal limits Gait & Station: normal Patient leans: N/A  Psychiatric Specialty Exam: Review of Systems  Psychiatric/Behavioral:  Negative for decreased concentration, dysphoric mood, hallucinations, self-injury and sleep disturbance. The patient is not nervous/anxious and is not hyperactive.     Blood pressure (!) 118/93, pulse 69, temperature 98.5 F (36.9 C), temperature source Oral, height 5' 10 (1.778 m), weight 141 lb (64 kg), SpO2 100%.Body mass index is 20.23 kg/m.  General Appearance: Casual  Eye Contact:  Good  Speech:  Clear and Coherent and Normal Rate  Volume:  Normal  Mood:  Euthymic  Affect:  Appropriate  Thought Process:  Coherent, Goal Directed, and Descriptions of Associations: Intact  Orientation:  Full (Time, Place, and Person)  Thought Content:  WDL  Suicidal Thoughts:  No  Homicidal Thoughts:  No  Memory:  Immediate;   Good Recent;   Good Remote;   Fair  Judgement:  Good  Insight:  Good  Psychomotor Activity:  Normal  Concentration:  Concentration: Good and Attention Span: Good  Recall:  Good  Fund of Knowledge:Good  Language: Good  Akathisia:  No  Handed:  Right   AIMS (if indicated):  not done  Assets:  Communication Skills Desire for Improvement Physical Health Resilience Social Support Vocational/Educational  ADL's:  Intact  Cognition: WNL  Sleep:  Good   Screenings: GAD-7    Flowsheet Row Office Visit from 01/16/2024 in [facility]  Total GAD-7 Score 0   PHQ2-9    Flowsheet Row Office Visit from 01/16/2024 in [facility] Telemedicine from 05/16/2020 in [facility]  PHQ-2 Total Score 0 0   Flowsheet Row Office Visit from 01/16/2024 in [facility] ED from 07/03/2023 in [facility] Emergency Department at [facility] ED from 02/25/2023 in [facility] Emergency  Department at [facility] RISK CATEGORY No Risk No Risk No Risk    Assessment and Plan:   Owais M. Mcgillis is a 38 year old male with a documented psychiatric history significant for ADHD and bipolar disorder who presents to [facility] for an evaluation.  Patient presents to the encounter requesting to overturn his current diagnoses so that he can enroll back into the [employer].  Patient states that he was given a diagnosis of bipolar disorder when first enrolling into the [employer].  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  Patient also reports that he was admitted to a hospital for mental health while serving in the [employer].  Patient reports that he was admitted under the order of his Solicitor and sergeant due to self-harm.  At the time of his hospitalization, patient denied exhibiting signs of self-harm and stated that he was stable.  Patient is not currently on any psychiatric medications at this time. Patient denies overt depressive symptoms nor does he  endorse anxiety.  A PHQ-9 screen was performed with the patient scoring a 0.  A GAD-7 screen was also performed with the patient scoring a 0.  Patient denies a past history of manic episodes.  He further denies a past history of sleep deficits, elevated energy, irritability, or engaging in risky behavior.  Patient denies paranoia.  He further denies auditory or visual hallucinations.  Based on my evaluation, patient does not appear to be exhibiting symptoms attributed to bipolar disorder.  Patient denies a past history of sleep deficits, elevated energy, irritability, or engaging in risky behavior.  Patient denies a past history of paranoia and does not appear to be responding to internal/external stimuli.  Based on these findings, it is highly unlikely that patient has a diagnosis of bipolar disorder.  Collaboration of Care: Psychiatrist AEB provider managing patient's psychiatric medication.  Patient/Guardian was advised Release of Information must be obtained prior to any record release in order to collaborate their care with an outside provider. Patient/Guardian was advised if they have not already done so to contact the registration department to sign all necessary forms in order for us  to release information regarding their care.   Consent: Patient/Guardian gives verbal consent for treatment and assignment of benefits for services provided during this visit. Patient/Guardian expressed understanding and agreed to proceed.   1. Evaluation by psychiatric service required (Primary) Patient was given a diagnosis of bipolar disorder by an International aid/development worker while patient went through [employer]. Patient does not appear to be exhibiting symptoms related to bipolar disorder such as sleep deficits, elevated energy, irritability, or risky behavior. Patient denies paranoia and does not appear to be responding to internal/external stimuli.  Patient to follow up in 3 weeks with Kapoor Sahil, MD Provider spent a total  of 50 minutes with the patient/reviewing patient's chart  Reginia FORBES Bolster, PA 7/31/20255:27 PM

## 2024-01-23 NOTE — Progress Notes (Unsigned)
 BH MD/PA/NP OP Progress Note  01/23/2024 10:30 AM Andres Weaver  MRN:  [id number]  Visit Diagnosis: No diagnosis found.  Assessment:  Andres Weaver is a 38 year old male with a documented psychiatric history significant for ADHD and bipolar disorder who presents to [facility] for an evaluation.  Patient presents to the encounter requesting to overturn his current diagnoses so that he can enroll back into the [employer].   Patient states that he was given a diagnosis of bipolar disorder when first enrolling into the [employer].  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  Patient also reports that he was admitted to a hospital for mental health while serving in the [employer].  Patient reports that he was admitted under the order of his Solicitor and sergeant due to self-harm.  At the time of his hospitalization, patient denied exhibiting signs of self-harm and stated that he was stable.   Patient is not currently on any psychiatric medications at this time. Patient denies overt depressive symptoms nor does he endorse anxiety.  A PHQ-9 screen was performed with the patient scoring a 0.  A GAD-7 screen was also performed with the patient scoring a 0.  Patient denies a past history of manic episodes.  He further denies a past history of sleep deficits, elevated energy, irritability, or engaging in risky behavior.  Patient denies paranoia.  He further denies auditory or visual hallucinations.   Based on my evaluation, patient does not appear to be exhibiting symptoms attributed to bipolar disorder.  Patient denies a past history of sleep deficits, elevated energy, irritability, or engaging in risky behavior.  Patient denies a past history of paranoia and does not appear to be responding to internal/external stimuli.  Based on these findings, it is highly  unlikely that patient has a diagnosis of bipolar disorder.  Risk Assessment: An assessment of suicide and violence risk factors was performed as part of this evaluation and is not  significantly changed from the last visit. While future psychiatric events cannot be accurately predicted, the patient does not  currently require acute inpatient psychiatric care and does not  currently meet   involuntary commitment criteria. Patient was given contact information for crisis resources, behavioral health clinic and was instructed to call 911 for emergencies.   Andres Weaver presents for follow-up evaluation. Today, 01/23/24, patient reports ***   Chief Complaint: No chief complaint on file.  HPI:  Andres Weaver is a 38 year old male with a documented psychiatric history significant for ADHD and bipolar disorder who presents to [facility] for an evaluation.   Patient presents to this encounter after being referred by [facility]. 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   Patient reports that he was given a diagnosis of bipolar disorder back in 2008 by an International aid/development worker when going through [employer] [facility]). 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 Patient reports that he was  hospitalized at [facility] sometime between 2010 and 2012.  He reports that his Solicitor and sergeant admitted him for self-harm though patient states that he exhibited no signs of self-harm at the time of his admission.  Patient states  that he was at the hospital for 2-1/2 weeks and placed on 2 different medications while there.  Patient does not remember what the medications names were but states that they did not agree with him.  Patient was eventually discharged from the hospital on a medication but does not remember the name of the medication.  Patient believes that the medication was an antihistamine of some sort and whenever he missed a meal while on the medication, he would become irritable. Patient denies any other history of psychiatric medication use.   Patient denies overt depressive symptoms nor does he endorse anxiety.  Patient denies a past history of manic episodes.  He further denies a past history of sleep deficits, elevated energy, irritability, or engaging in risky behavior.  Patient denies paranoia.  He further denies auditory or visual hallucinations.   As previously mentioned, patient has a past history of hospitalization due to mental health.  Patient denies a past history of suicide attempt.  A PHQ-9 screen was performed with the patient scoring a 0.  A GAD-7 screen was also performed with the patient scoring a 0.   Patient is alert and oriented x 4, calm, cooperative, and fully engaged in conversation during the encounter.  Patient endorses great mood.  Patient denies suicidal or homicidal ideations.  He further denies auditory or visual hallucinations and does not appear to be responding to internal/external stimuli.  Patient denies paranoia or delusional thoughts.  Patient endorses good sleep and receives on average 7 to 8 hours of sleep per night.  Patient endorses good appetite and eats on average 3 meals per day.  Patient endorses alcohol consumption sparingly.  Patient endorses tobacco use and smokes on average 5 to 10 cigarettes/day.  Patient denies illicit drug use.   Past Psychiatric History:  Patient reports that he was given a diagnosis of bipolar disorder back in 2008 by an International aid/development worker when going  through [employer] [facility]). - Patient was given a diagnosis of bipolar affective disorder, remission status unspecified (ICD-10: F31.9: Bipolar disorder, unspecified) - Patient informed provider that when he was assessed through [facility], the provider looked at him and stated that he had bipolar disorder.   Patient reports that he was recently given a diagnosis of ADHD.   Patient endorses a past history of hospitalization due to mental health.  He reports that he was hospitalized at [facility] sometime around 2010 or 2012. - Patient states that he was admitted to the hospital under his sergeant and Solicitor. - Patient reports that he does not know or understand why he was admitted but states that his sergeant and Solicitor admitted him for self-harm.  He reports that his admission was his word against theirs.   Patient denies a past history of suicide attempts.   Patient denies a past history of homicide attempts.  Past Medical History: No past medical history on file.  Past Surgical History:  Procedure Laterality Date   LAPAROSCOPIC APPENDECTOMY N/A 05/13/2019   Procedure: APPENDECTOMY LAPAROSCOPIC;  Surgeon: Signe Mitzie LABOR, MD;  Location: [facility] OR;  Service: General;  Laterality: N/A;    Family Psychiatric History: Patient denies a family history of psychiatric illness.   Family History:  Family  History  Problem Relation Age of Onset   Diabetes Neg Hx     Social History:  Social History   Socioeconomic History   Marital status: Divorced    Spouse name: Not on file   Number of children: Not on file   Years of education: Not on file   Highest education level: Not on file  Occupational History   Not on file  Tobacco Use   Smoking status: Every Day    Current packs/day: 1.00    Types: Cigarettes   Smokeless tobacco: Never  Substance and Sexual Activity   Alcohol use: No   Drug use: Yes    Types: Marijuana   Sexual  activity: Not on file  Other Topics Concern   Not on file  Social History Narrative   Not on file   Social Drivers of Health   Financial Resource Strain: Not on file  Food Insecurity: Not on file  Transportation Needs: Not on file  Physical Activity: Not on file  Stress: Not on file  Social Connections: Not on file    Allergies:  Allergies  Allergen Reactions   Iodine Nausea And Vomiting   Penicillins     Current Medications: Current Outpatient Medications  Medication Sig Dispense Refill   acetaminophen  (TYLENOL ) 500 MG tablet Take 1 tablet (500 mg total) by mouth every 6 (six) hours as needed for mild pain or fever.     benzonatate  (TESSALON ) 100 MG capsule Take 1 capsule (100 mg total) by mouth every 8 (eight) hours. 21 capsule 0   cyclobenzaprine  (FLEXERIL ) 10 MG tablet Take 1 tablet (10 mg total) by mouth 3 (three) times daily as needed for muscle spasms. 15 tablet 0   dicyclomine  (BENTYL ) 10 MG capsule Take 1 capsule (10 mg total) by mouth 3 (three) times daily before meals. 120 capsule 0   fluticasone  (FLONASE ) 50 MCG/ACT nasal spray Place 2 sprays into both nostrils daily. 16 g 0   ibuprofen  (ADVIL ) 600 MG tablet Take 1 tablet (600 mg total) by mouth every 6 (six) hours as needed. 15 tablet 0   omeprazole  (PRILOSEC) 20 MG capsule Take 1 capsule (20 mg total) by mouth 2 (two) times daily before a meal. 180 capsule 0   No current facility-administered medications for this visit.     Musculoskeletal: Strength & Muscle Tone: {desc; muscle tone:32375} Gait & Station: {PE GAIT ED WJUO:77474} Patient leans: {Patient Leans:21022755}  Psychiatric Specialty Exam: There were no vitals taken for this visit.There is no height or weight on file to calculate BMI. Review of Systems  General Appearance: {Appearance:22683}  Eye Contact:  {BHH EYE CONTACT:22684}  Speech:  {Speech:22685}  Volume:  {Volume (PAA):22686}  Mood:  {BHH MOOD:22306}  Affect:  {Affect (PAA):22687}   Thought Content: {Thought Content:22690}   Suicidal Thoughts:  {ST/HT (PAA):22692}  Homicidal Thoughts:  {ST/HT (PAA):22692}  Thought Process:  {Thought Process (PAA):22688}  Orientation:  {BHH ORIENTATION (PAA):22689}    Memory: {BHH MEMORY:22881}  Judgment:  {Judgement (PAA):22694}  Insight:  {Insight (PAA):22695}  Concentration:  {Concentration:21399}  Recall:  not formally assessed ***  Fund of Knowledge: {BHH GOOD/FAIR/POOR:22877}  Language: {BHH GOOD/FAIR/POOR:22877}  Psychomotor Activity:  {Psychomotor (PAA):22696}  Akathisia:  {BHH YES OR NO:22294}  AIMS (if indicated): {Desc; done/not:10129}  Assets:  {Assets (PAA):22698}  ADL's:  {BHH JIO'D:77709}  Cognition: {chl bhh cognition:304700322}  Sleep:  {BHH GOOD/FAIR/POOR:22877}   Metabolic Disorder Labs: No results found for: HGBA1C, MPG No results found for: PROLACTIN No results found for: CHOL,  TRIG, HDL, CHOLHDL, VLDL, LDLCALC No results found for: TSH  Therapeutic Level Labs: No results found for: LITHIUM No results found for: VALPROATE No results found for: CBMZ   Screenings: GAD-7    Flowsheet Row Office Visit from 01/16/2024 in [facility]  Total GAD-7 Score 0   PHQ2-9    Flowsheet Row Office Visit from 01/16/2024 in [facility] from 05/16/2020 in [facility] [place]  PHQ-2 Total Score 0 0   Flowsheet Row Office Visit from 01/16/2024 in [facility] ED from 07/03/2023 in [facility] Emergency Department at [facility] ED from 02/25/2023 in [facility] Emergency Department at [facility]  C-SSRS RISK CATEGORY No Risk No Risk No Risk    Collaboration of Care: Collaboration of Care: [facility] OP Collaboration of Rjmz:[id number]}  Patient/Guardian was advised Release of Information must be obtained prior to any record release in  order to collaborate their care with an outside provider. Patient/Guardian was advised if they have not already done so to contact the registration department to sign all necessary forms in order for us  to release information regarding their care.   Consent: Patient/Guardian gives verbal consent for treatment and assignment of benefits for services provided during this visit. Patient/Guardian expressed understanding and agreed to proceed.    Rhetta Cleek, MD 01/23/2024, 10:30 AM

## 2024-02-06 ENCOUNTER — Ambulatory Visit (INDEPENDENT_AMBULATORY_CARE_PROVIDER_SITE_OTHER): Payer: Self-pay

## 2024-02-06 ENCOUNTER — Encounter (HOSPITAL_COMMUNITY): Payer: Self-pay

## 2024-02-06 VITALS — BP 129/80 | HR 82 | Ht 70.0 in | Wt 145.0 lb

## 2024-02-06 DIAGNOSIS — Z008 Encounter for other general examination: Secondary | ICD-10-CM
